# Patient Record
Sex: Female | Born: 1965
Health system: Southern US, Community
[De-identification: ages and names within clinical notes are randomized; demographics above are authoritative.]

## PROBLEM LIST (undated history)

## (undated) DIAGNOSIS — Z808 Family history of malignant neoplasm of other organs or systems: Secondary | ICD-10-CM

## (undated) DIAGNOSIS — Z800 Family history of malignant neoplasm of digestive organs: Secondary | ICD-10-CM

## (undated) DIAGNOSIS — Z803 Family history of malignant neoplasm of breast: Secondary | ICD-10-CM

## (undated) DIAGNOSIS — Z836 Family history of other diseases of the respiratory system: Secondary | ICD-10-CM

## (undated) HISTORY — DX: Family history of malignant neoplasm of breast: Z80.3

## (undated) HISTORY — DX: Family history of other diseases of the respiratory system: Z83.6

## (undated) HISTORY — DX: Family history of malignant neoplasm of digestive organs: Z80.0

## (undated) HISTORY — DX: Family history of malignant neoplasm of other organs or systems: Z80.8

---

## 1998-03-31 ENCOUNTER — Inpatient Hospital Stay (HOSPITAL_COMMUNITY): Admission: AD | Admit: 1998-03-31 | Discharge: 1998-04-02 | Payer: Self-pay | Admitting: Obstetrics and Gynecology

## 1998-04-03 ENCOUNTER — Encounter: Admission: RE | Admit: 1998-04-03 | Discharge: 1998-07-02 | Payer: Self-pay | Admitting: Obstetrics and Gynecology

## 1999-10-18 ENCOUNTER — Ambulatory Visit (HOSPITAL_COMMUNITY): Admission: RE | Admit: 1999-10-18 | Discharge: 1999-10-18 | Payer: Self-pay | Admitting: Gastroenterology

## 2001-06-25 ENCOUNTER — Ambulatory Visit (HOSPITAL_COMMUNITY): Admission: AD | Admit: 2001-06-25 | Discharge: 2001-06-25 | Payer: Self-pay | Admitting: Obstetrics and Gynecology

## 2001-09-15 ENCOUNTER — Inpatient Hospital Stay (HOSPITAL_COMMUNITY): Admission: AD | Admit: 2001-09-15 | Discharge: 2001-09-17 | Payer: Self-pay | Admitting: Obstetrics and Gynecology

## 2001-09-18 ENCOUNTER — Encounter: Admission: RE | Admit: 2001-09-18 | Discharge: 2001-10-18 | Payer: Self-pay | Admitting: Obstetrics and Gynecology

## 2001-10-31 ENCOUNTER — Other Ambulatory Visit: Admission: RE | Admit: 2001-10-31 | Discharge: 2001-10-31 | Payer: Self-pay | Admitting: Obstetrics and Gynecology

## 2002-11-01 ENCOUNTER — Other Ambulatory Visit: Admission: RE | Admit: 2002-11-01 | Discharge: 2002-11-01 | Payer: Self-pay | Admitting: Obstetrics and Gynecology

## 2003-12-19 ENCOUNTER — Other Ambulatory Visit: Admission: RE | Admit: 2003-12-19 | Discharge: 2003-12-19 | Payer: Self-pay | Admitting: Obstetrics and Gynecology

## 2005-01-17 ENCOUNTER — Ambulatory Visit (HOSPITAL_COMMUNITY): Admission: RE | Admit: 2005-01-17 | Discharge: 2005-01-17 | Payer: Self-pay | Admitting: Gastroenterology

## 2005-03-09 ENCOUNTER — Other Ambulatory Visit: Admission: RE | Admit: 2005-03-09 | Discharge: 2005-03-09 | Payer: Self-pay | Admitting: Obstetrics and Gynecology

## 2005-08-08 ENCOUNTER — Ambulatory Visit (HOSPITAL_COMMUNITY): Admission: RE | Admit: 2005-08-08 | Discharge: 2005-08-08 | Payer: Self-pay | Admitting: Obstetrics and Gynecology

## 2005-08-08 ENCOUNTER — Encounter (INDEPENDENT_AMBULATORY_CARE_PROVIDER_SITE_OTHER): Payer: Self-pay | Admitting: *Deleted

## 2010-06-12 ENCOUNTER — Encounter: Admission: RE | Admit: 2010-06-12 | Discharge: 2010-06-12 | Payer: Self-pay | Admitting: Orthopedic Surgery

## 2010-10-20 ENCOUNTER — Encounter: Payer: Self-pay | Admitting: Psychology

## 2010-10-20 DIAGNOSIS — F329 Major depressive disorder, single episode, unspecified: Secondary | ICD-10-CM

## 2010-10-21 ENCOUNTER — Ambulatory Visit: Payer: Self-pay | Admitting: Psychology

## 2010-10-27 ENCOUNTER — Encounter: Payer: Self-pay | Admitting: Psychology

## 2010-11-12 ENCOUNTER — Encounter: Payer: Self-pay | Admitting: Psychology

## 2010-12-15 ENCOUNTER — Encounter: Payer: Self-pay | Admitting: Psychology

## 2010-12-16 NOTE — Miscellaneous (Signed)
Summary: Consent: services provided by [facility]  Consent: services provided by [facility]   Imported By: Knox Royalty 10/27/2010 10:29:43  _____________________________________________________________________  External Attachment:    Type:   Image     Comment:   External Document
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
## 2010-12-16 NOTE — Assessment & Plan Note (Signed)
Summary: Behavioral Medicine Student Consultation   History of Present Illness: Patient presented with pleasant affect. Rate of speech was normal and thought content was clear and goal-oriented. Patient rated her pain symptoms over the past week at 6 out of 10 (with 10 being the worst). Patient stated that she went to an acupuncture treatment earlier in the day and rated her pain in her shoulders and neck for today at a 4. Patient also reported following through with homework from previous session to engage in a pleasant event (she got a massage). Patient indicated that she recently experienced several psychosocial stressors. Specifically, the patient's 44 yo maternal grandmother was hospitalized for medical reasons and subsequenty placed in assisted living. Additionally, the patient reported that her mother recently experienced medical difficulties during a recent shopping trip with her. Patient reported feeling stressed by her role as a caregiver for her mother, grandmother, as well as her children. Patient stated that she would be helping move her grandmother into assisted living.   Impression & Recommendations:  Problem # 1:  DEPRESSIVE DISORDER NOT ELSEWHERE CLASSIFIED (ICD-311)  During the session, Yulia and I reviewed her homework from the previous week. We discussed possible directions to take for the session and established a goal to address symptoms of dysphoria and irritability. I introduced cognitive behavioral therapy (CBT) as an approach to address these symptoms and Rosela agreed with this plan. I used two recent examples from Jenevieve's life during the past month to explain the influence of thoughts on her mood, physiology, and behavior. Delanee was receptive to using CBT to address her symptoms; she noted a tendency to engage in negative thinking when difficult situations arose in her life. Patient noted that she had an upcoming stressor in her life - spending time with her paternal  aunt over the holidays - and we briefly discussed options for coping with negative interactions with her. Patient noted that she tended to either cry or drink alcohol to cope with her aunt. I explained a CBT handout that involved tracking situations, emotions, and behaviors and requested Kammie complete the handout for homework. I solicited feedback from Ogden about the structure of the sessions and she stated that they went "well" and stated that there were no parts of the session that were unhelpful.  Plan:  Appointment scheduled for 12/30/2011at 10 am. (Patient noted that she felt that she was doing better and preferred to meet after the holidays). Discuss common cognitive distortions and review cognitive distortions handout. Use example from homework to introduce idea of a thought record and complete thought record in session. Assign thought record for homework.   Michel Bickers  Behavioral Medicine Student  October 27, 2010 4:10 PM   Orders: No Charge Patient Arrived (NCPA0) (NCPA0)   Orders Added: 1)  No Charge Patient Arrived (NCPA0) [NCPA0]  Appended Document: Behavioral Medicine Student Consultation Reviewed note.  Will review encounter with student next opportunity.  Agree with assessment and plan.

## 2010-12-16 NOTE — Assessment & Plan Note (Signed)
Summary: Behavioral Medicine Student Consultation   History of Present Illness: Patient is a 45 yo married Caucasian female who was referred by her orthopedist for a behavioral consultation. Patient requested consultation to address symptoms of dysphoria, decreased motivation, and low energy. Patient also reported often feeling on edge. Patient denied current sleep or appetite problems. Patient stated that she tends to feel most dysphoric during the winter monthes (January - March) and noticed this pattern over the past 2 years. Additionally, she reported current pain symptoms in her neck, shoulders, and upper back; patient indicated that her pain symptoms had been evaluated, but no physical cause had been identified. Patient reported that her pain symptoms began in March 2011 and symptoms of dysphoria began in the spring of 2011. Patient reported several recent stressors, including the unexpected death of her father in 2011/05/18and her sister's car accident in August 2011. Patient noted that she felt she could benefit from additional processing of her grief.  Relevant Medical History: Note that patient does not have a primary care physician at [facility] (no records in EMR). As stated above, patient reported pain in her back, shoulders, and neck. She also reported "possible TMJ" symptoms per her ENT, and stated that her ears were often "stopped up." Patient also reported irregularity in her menstrual cycle over the past year.  Medications: Patient does not currently take any medications. She started Lyrica on 10/15/10 (prescribed by her orthopedist), but stopped it due to side effects. Patient has taken Xanax in the past for anxiety related to flying and Ambien in the past for sleep difficulty during travel as prescribed by her OB/GYN. Patient reported that she was prescribed birth control 2 years ago to treat her menstrual cycle irregularity, but stopped it due to side  effects.     Treatment History: Patient indicated that she participated in individual therapy (3-4 sessions) approximately 2-3 years ago, but did not find the sessions effective due to lack of structure.  Education and Occupational History: Patient graduated from [employer]. 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  Family History: Patient currently lives in [place] with her husband of 17 years and two daughters (ages 38 and 62). Patient's husband is employed as a Psychologist, occupational. Patient reported no marital problems. Patient reported close relationships with all family members. She reported some stress associated with parenting her 62 yo daughter. Patient has a 48 yo sister and reported that they have a close relationship. Patient reported that she lost her brother in a car accident in the 70s. Patient's mother lives in [place].  Family History of Psychopathology: Patient reported that her 34 yo daughter has central auditory processing disorder. Patient reported that her sister was diagnosed with depression and has current substance abuse problems (alcohol and marijuana). Patient reported that her maternal uncle was diagnosed with schizophrenia.  Social History: Patient reported that she exercises regulary (tennis and running) approximately 50-60 minutes 5-6 times per week. Patient indicated that she has made friends through exercise and has several supportive friends that she sees regularly.  Other: Patient reported that she tends to feel better when she is at an appropriate weight, and feels worse when her weight is up. She reported extreme dieting in the past (750 calories per day) when she was a teenager, and stated that she began dieting at age 30. Note that patient's current weight appears to be normal.     Impression & Recommendations:  Problem # 1:  DEPRESSIVE DISORDER NOT ELSEWHERE CLASSIFIED (ICD-311) Patient  was appropriately groomed for the session and her weight appeared normal for her height. Patient reported experiencing dysthymic mood. She presented with pleasant and appropriate affect throughout the session and her rate of speech and rythym was normal. Patient's thought process was clear and goal-directed. Patient denied current or past suicidal or homicidal ideation.  PHQ-9: Patient endorsed feeling tired or having little energy nearly every day, feeling down more than half the days, several days with little interest. Total score = 6  Overview: Patient presents with dysphoria, pain symptoms, unresolved grief related to father's death, and stress associated with parenting her 75 yo daughter.  Plan: Will meet with patient on 10/27/10 at 1 pm. Will offer patient a choice regarding the agenda for the second session (dysphoria, stress with parenting, grief). Will ask patient about family members' perceptions of her symptoms. Will use a CBT approach to examine automatic thoughts that arise when the patient has a dysphoric or irritable mood (if she chooses to examine dysphoria). Will request feedback from the patient regarding the outcome of the session to further build the therapeutic alliance.    Michel Bickers  Behavioral Medicine Student  October 20, 2010 4:20 PM   Appended Document: Orders Update    Clinical Lists Changes  Orders: Added new Service order of No Charge Patient Arrived (NCPA0) (NCPA0) - Signed
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
## 2010-12-30 ENCOUNTER — Encounter: Payer: Self-pay | Admitting: Psychology

## 2010-12-30 DIAGNOSIS — F329 Major depressive disorder, single episode, unspecified: Secondary | ICD-10-CM

## 2010-12-30 NOTE — Assessment & Plan Note (Signed)
Patient exhibited pleasant affect throughout most of the session. She appeared frustrated at times when discussing her sister, and once became tearful. We primarily discussed the patient's relationship with her sister. I educated the patient about the requirements for hospitalization and asked the her about the severity of her sister's depression. We concluded that there was no evidence that her sister would harm herself. We also discussed evidence that her sister could improve (e.g., she had been depressed before and gotten better, most depressions remit on their own). We also discussed the function of the patient checking on her sister. I brought up the idea that the patient had a pattern of checking on family members, including her mother and daughter in addition to her sister. She noted that she mainly checked on her sister to alleviate her own anxiety and made the connection that her checking on her sister probably was not helpful, and could potentially be harmful, to her sister. She agreed to reduce her checking behavior to every other day or less. We also discussed the pros and cons of the patient and her family's idea to make a deal with her sister so that she attended therapy. Throughout the session, we discussed the difference between helping her sister and trying to manage and fix her sister. Additionally, we discussed the notion that the patient was not responsible for her sister's happiness. Toward the end of the session, the patient generated a list of conclusions that she had made based on the session to help guide her thinking. She agreed to review conclusions over the week and if she woke up worrying at night. I also recommended deep breathing and/or reading to help her fall back asleep. We also discussed her goals for future therapy sessions; she indicated that she wanted to spend at least one more session on the current relationship with her sister and then wanted to spend some time discussing her  parenting.   Plan: Patient requested to meet in one week. Scheduled session for 01/05/11 at 10 am. Check in about homework regarding reducing contact with sister, reviewing conclusions. Examine patient's current thoughts about her relationship with her sister; if they are still catastrophic (e.g. sister may be suicidal if I do not check on her, etc.), complete a thought record in session examining validity of those thoughts. Further discuss the function of patient's desire to manage her sister's life, as well as how this relates to other relationships.   Michel Bickers   Behavioral Medicine Student 12:16 pm  NOTE REVIEWED AND SIGNED BY SUPERVISING PSYCHOLOGIST.  CASE DISCUSSED.

## 2010-12-30 NOTE — Progress Notes (Deleted)
  Subjective:    Patient ID: [name], female    DOB: 10/05/1966, 44 y.o.   MRN: [id number]  HPI    Review of Systems     Objective:   Physical Exam        Assessment & Plan:   

## 2010-12-30 NOTE — Progress Notes (Signed)
Patient reported that she primarily wanted to discuss her relationship with her sister. She indicated that her sister missed her individual therapy appointment yesterday and was uncertain if she would reschedule it. She noted that her sister had sent an e-mail to family members to tell them this and asked them not to call her. Patient reported that she felt angry that her sister would not take steps to help herself, and also reported feeling very worried about her sister. She questioned me about what would be necessary to "commit" her sister into the hospital. She indicated that she was worrying about her sister constantly and that her sister's depression and inability to access help was "hijacking" her life. Patient indicated that she sometimes woke up during the night worrying about her sister and texted her daily to make sure that her sister was okay. The patient described her own behavior as obsessive. She also noted that she and her aunt were thinking about making a deal with her sister such that they would pay her bills in exchange for her going to therapy appointments. The patient wanted my feedback about this decision and her relationship with her sister in general. In regard to her relationship with her daughter, the patient reported that she felt it had improved primarily because her attention had been focused on her sister rather than her daughter. She noted that she went shopping with her daughter and that the shopping trip was enjoyable

## 2011-01-05 ENCOUNTER — Encounter: Payer: Self-pay | Admitting: Psychology

## 2011-01-05 NOTE — Progress Notes (Addendum)
History of Present Illness:  [name] primarily discussed her relationship with her sister and 44 year old daughter, [name], during the session. In regard to her homework, she stated that she had been able to successfully reduce her contact with her sister. She stated that she did not call or text her sister to check on her, and had three natural interactions with her over the past week, one of which was initiated by her sister. [name] also reported that she had decided not to be involved in her sister's financial affairs and was not loaning her any money. Additionally, [name] noted that her sister had e-mailed her to say that she was feeling better mentally and had some recent interactions with friends and applied for a new job. [name] reported that her anxiety symptoms had decreased due to her reduced contact with her sister, and the fact that her sister's depressive symptoms seemed to be getting better. She noted that she was no longer waking in the middle of the night worried about her.   In regard to her relationship with her daughter, [name] indicated that she was concerned that [name] acted disrespectful toward her at home. For example, she stated that [name] recently told her "No" when asked to complete a reading assignment. [name] also noted that [name] was disorganized and did not have a set time that she used for homework. She noted that [name] was very polite and respectful at school, but sometimes refused to complete tasks at home. [name] stated that [name] often indicated that she was simply being sarcastic, and that her behavior was a reflection of her personality, but [name] did not believe this to be true. In terms of discipline, [name] indicated that she tried to pick the right behaviors to discipline and would take away [name]'s cell phone if necessary. [name] noted that her husband had a different parenting style from hers, and tended not to be bothered by [employer] behavior. Specifically, she  stated that he is a caring father, but is less engaged. Additionally, [name] questioned whether it was appropriate for her to occasionally check [name]'s messages. She also indicated that [name], although underweight (5'5, 105 lbs.), enjoys eating sweets and often seems concerned about her weight; [name] questioned how to handle this behavior. [name] noted that she tried to talk about food in terms of good and poor choices, rather than food's effect on weight, looks, etc.   Assessment:  [name] presented with pleasant affect and her thought content was clear and goal-directed. As noted, [name] reported reduced anxiety symptoms. She also indicated that her neck and shoulder pain was 90% improved; she stated that her physical therapist had determined that the pain was related to weakness in her back, and stated that her pain has been better since she started specific back and shoulder exercises. We reviewed [name]'s progress in relation to her sister. I reinforced her decision to reduce her checking behaviors and refrain from loaning her sister money. I also underscored the notion that [name]'s decision to reduce contact with her led to reduced anxiety. [name] requested psychoeducation regarding the difference between abuse and addiction (which I provided) in relation to her sister's substance abuse. She noted that her sister's difficulties with alcohol and marijuana seemed consistent with abuse.   In regard to her relationship with her daughter, I introduced the idea of [name] collaborating with her daughter to establish a set homework time so that [name] would not feel that it was necessary to ask [name] about her homework throughout the evening. [name]  was receptive to this idea. We also discussed communicating with [name] in a way that gave her a choice (e.g., "When are you going to do your homework?") rather than requesting her to complete a task while she is in the middle of an activity. I also noted  that [name] obtains good grades and that it may be reasonable to allow [name] to complete homework on her own (although I do not think [name] is willing to go this route). I also reinforced and reminded [name] that a lot was going well with her daughter, and that these successes were likely related to positive influences from  [name] and her husband. Additionally, we discussed ways for December to enjoy spending time with [name] on a daily basis. She noted that they both liked to watch several TV shows together and I encouraged [name] to plan to watch several shows with [name] during the week. In regard to monitoring [name]'s text messages, I did not give a definitive answer but noted that she and her husband should be in agreement regarding this rule. We spoke briefly regarding [name]'s concerns about her weight; I reinforced [name] for focusing on food as a source of nutrition, rather than its effect on weight.   Plan:   Meet in two weeks. Review homework to establish homework time with [name] and engage in pleasant events with her (TV). [name] questions her parenting; find ways in session to restore her confidence in her abilities. Determine if communication with [name] continues to be an area of difficulty and assess and address those areas; if the relationship has improved, revisit goals for therapy.   Patient seen and visit documented by [name], [employer] student.  Visit and note reviewed by [name], Psy.D.  Supervision provided.

## 2011-01-05 NOTE — Progress Notes (Deleted)
  Subjective:    Patient ID: [name], female    DOB: 11/28/1965, 44 y.o.   MRN: [id number]  HPI    Review of Systems     Objective:   Physical Exam        Assessment & Plan:   

## 2011-01-20 ENCOUNTER — Encounter: Payer: Self-pay | Admitting: Psychology

## 2011-01-20 DIAGNOSIS — F329 Major depressive disorder, single episode, unspecified: Secondary | ICD-10-CM

## 2011-01-20 NOTE — Assessment & Plan Note (Addendum)
Client presented with pleasant affect and her thought content was clear and goal-directed. She reported no current symptoms of psychopathology. We primarily discussed her relationship with her daughter, Maralyn Sago. Overall, client reported no current problems within their relationship. We revisited setting a standard homework time and client noted this was difficult due to Ivor busy schedule, but agreed to the idea of setting a time by which homework needed to be complete. We also discussed the appropriateness of client checking Sarah's text messages and I provided client with research documenting that the majority of parents check the content of their teenagers' phones-and that they have a right to do so as parents. Client also requested some guidance regarding how best to help her daughter manage money she earned babysitting and we briefly discussed this issue. I reinforced client's parenting style, and provided psychoeducation regarding authoritative parenting and its association with positive outcomes. Toward the end of the session, we discussed termination of therapy given client's current mental state and client suggested that we schedule one final session in three weeks in case she had issues to discuss. We agreed that client should call and cancel the session if she felt that it was unnecessary.  Plan: Meet in three weeks for a final session to discuss parenting unless otherwise noted from client.  Session conducted by Michel Bickers, [employer] Student Case and note reviewed with Spero Geralds, Psy.D., supervisor

## 2011-01-20 NOTE — Progress Notes (Deleted)
  Subjective:    Patient ID: [name], female    DOB: 10/06/1966, 44 y.o.   MRN: [id number]  HPI    Review of Systems     Objective:   Physical Exam        Assessment & Plan:   

## 2011-01-20 NOTE — Progress Notes (Signed)
Client reported that she had little to discuss during the session. She noted that overall her parenting had been going well with her daughter, Maralyn Sago. Client reported that she had been making an effort to spend more time engaging in fun activities with Maralyn Sago and indicated that Maralyn Sago had recently opened up to her regarding current events at school. Client also reported that Maralyn Sago continues to obtain excellent grades. In terms of her relationship with her sister, client reported that she had remained "uninvolved" with her sister's finances and mental health treatment and communicates with her primarily about general topics. Client also noted that her sister's depressive symptoms have improved to some extent over the last two weeks.

## 2011-03-08 ENCOUNTER — Encounter: Payer: Self-pay | Admitting: Psychology

## 2011-03-08 DIAGNOSIS — F329 Major depressive disorder, single episode, unspecified: Secondary | ICD-10-CM

## 2011-03-08 NOTE — Progress Notes (Signed)
Reviewed note and provided supervision to [facility].  Case is being closed with goals identified as met.

## 2011-03-08 NOTE — Assessment & Plan Note (Signed)
Patient presented with pleasant affect and her thought content was clear and goal-directed. We primarily discussed patient's parenting of her 44 year old daughter. Regarding all of the parenting issues she raised, I encouraged patient to discuss her concerns with her husband and to make decisions and parent as a team when possible. I explained that her daughter's desire to spend time in her room likely reflects a natural desire for increased independence in adolescence. Regarding her daughter's tendency to lock her door, we discussed the notion of having a family policy of knocking before opening when doors are closed, and not locking doors. Patient was receptive to this idea. I also encouraged patient to validate her daughter's interest in privacy, but also explain her reasons for requiring that her door remain unlocked. In terms of her daughter's recent spending habits, I explained the idea of using natural consequences with children and adolescents to teach a behavior or skill; specifically, I explained that allowing her daughter to go on a shopping trip without money may help her learn to better manage her finances. We also discussed the idea of patient allowing her daughter to return recent items she purchased back to the store so that she would have spending money for the weekend. Patient also raised the idea of allowing her daughter to borrow a small amount of money on the condition she pay it back. Patient acknowledged this strategy would not be as effective in helping her daughter understand the value of money. Patient indicated that she would talk to her husband about it and they would make a decision together. Regarding her daughter's school habits, we discussed the idea of patient reviewing her daughter's planner with her at the beginning of the week for consecutive weeks, and eventually allowing her daughter to manage her planner and assignments on her own. I discouraged patient from working with her  daughter to fill out her planner. We discussed the notion that if her daughter forged her signature again on an assignment, there should be a consequence for that behavior. At the end of the session, we discussed several gains the patient had made in therapy and patient thanked me for my services.

## 2011-03-08 NOTE — Progress Notes (Signed)
  Subjective:    Patient ID: [name], female    DOB: 12/21/1965, 44 y.o.   MRN: [id number]  HPI    Review of Systems     Objective:   Physical Exam        Assessment & Plan:   

## 2011-03-08 NOTE — Progress Notes (Signed)
[employer] Graduate Student Michel Bickers met with [name] for a final therapy session.  This is her documentation from that visit:  Patient indicated that overall she had been doing well since her last session. She reported that she has maintained a healthier relationship with her sister and has weekly telephone contact, but is no longer overly involved in her life. Patient mainly wanted to discuss several parenting issues regarding her 78 year old daughter. She indicated that her daughter was spending more time in her room given that she did not have as many after-school activities, and was curious if this was typical behavior for adolescents. She reported no changes in her daughter's mood or concerns about depression, noting that her daughter liked to read magazines and play with makeup in her room. Patient also indicated that her daughter sometimes locked her door, which bothered her. Additionally, patient reported that her daughter recently spent all of her money from babysitting on makeup, and now had no money to spend this weekend when she goes shopping with a friend, and wondered if she should loan her daughter money. She reported having a difficult time saying "no" to her children. Lastly, patient reported that her daughter was earning A's and B's but did not consistently use a planner, and had recently forged her signature on an assignment because she had forgotten to get the signature in time (her daughter disclosed this to her). Patient wanted to know how best to motivate her daughter to use a planner so that she did not forget assignments or requirements.

## 2011-03-08 NOTE — Progress Notes (Signed)
  Subjective:    Patient ID: [name], female    DOB: 05/24/1966, 45 y.o.   MRN: [id number]  HPI    Review of Systems     Objective:   Physical Exam        Assessment & Plan:

## 2011-04-01 NOTE — Procedures (Signed)
. [facility]  Patient:    [name]                      MRN: [id number] Proc. Date: 10/18/99 Adm. Date:  [id number] Attending:  Louie Bun CC:         Guy Sandifer. Arleta Creek, M.D.                           Procedure Report  PROCEDURE:  Colonoscopy.  SURGEON:  John C. Madilyn Fireman, M.D.  INDICATIONS:  Family history of colon cancer in a first degree relative at a young age.  PROCEDURE:  The patient was placed in the left lateral decubitus position and placed on the pulse monitor with continuous low-flow oxygen delivered by nasal cannula.  She was sedated with 100 mg IV Demerol and 10 mg IV Versed.  The Olympus video colonoscope was inserted into the rectum and advanced to the cecum, confirmed by transillumination of McBurneys point and visualization of the ileocecal valve and appendiceal orifice.  The prep was excellent.  The cecum, ascending, transverse, descending, and sigmoid colon all appeared normal with no masses, polyps, diverticuli or other mucosal abnormalities.  The rectum, likewise appeared normal and retroflexed view of the anus revealed some small internal hemorrhoids. The colonoscope is then withdrawn and the patient returned to the recovery room in stable condition.  She tolerated the procedure well and there were no immediate  complications.  IMPRESSION:  Internal hemorrhoids, otherwise normal colonoscopy.  PLAN:  Repeat colonoscopy in five years. DD:  10/18/99 TD:  10/19/99 Job: [id number]

## 2011-04-01 NOTE — Op Note (Signed)
[facility]  Patient:    [name], [name] Visit Number: [id number] MRN: [id number]          Service Type: OBS Location: 910A 9105 [id number] Attending Physician:  Soledad Gerlach Dictated by:   Guy Sandifer Arleta Creek, M.D. Proc. Date: 09/15/01 Admit Date:  09/15/2001 Discharge Date: 09/17/2001                             Operative Report  DELIVERY NOTE  SURGEON:                      Fayrene Fearing E. Arleta Creek, M.D.  INDICATIONS AND CONSENT:      The patient is a 45 year old married white female, G2, P53, [facility] September 23, 2001.  Her prenatal care was complicated by a history of rapid labor and also a shoulder dystocia during delivery with a 6 b 8 oz baby.  Group B strep culture is negative.  HOSPITAL COURSE:              The patient was admitted to the hospital with a cervical exam of 3 cm dilation, 50% effacement, -2 station and vertex. Artificial rupture of membranes for clear fluid was carried out.  The patient then ambulated.  She was subsequently started on Pitocin and had an epidural placed.  She was then noted to have decelerations.  Cervical examination at that time revealed complete dilation, complete effacement and +2 station.  The baby was decelerating to the 40s with contractions.  The patient had a very dense epidural and she was pushing fairly well.  The baby was +3 station. However, secondary to the continued deep decelerations, vacuum extraction was recommended.  This was discussed with the patient and her husband and a 1/40,000 risk of severe morbidity or mortality was discussed.  All questions were answered.  A second-degree midline episiotomy was performed.  The Kiwi vacuum extractor was then placed and suction was applied to the green zone on the handle.  Then, over one contraction with one easy pull using two fingers for pulling only, the vertex was delivered without difficulty in the OA presentation.  He oral and nasopharynx were  suctioned.  A nuchal cord x 1 was noted.  It was a little too tight to reduce and the baby was delivered through it.  Good cry and tone was noted on delivery.  The oral and nasopharynx were further suctioned.  The cord was clamped and cut.  The placenta was manually delivered and three vessels were noted.  The cervix and vagina were without lesion.  The second-degree midline episiotomy was repaired in the standard fashion with 2-0 Rapide.  The patient and infant were stable in the labor and delivery room.  Viable female infant.  Apgars of 8 and 9 at one and five minutes respectively were noted.  Arterial cord pH and birth weight are pending a the time of dictation. Dictated by:   Guy Sandifer Arleta Creek, M.D. Attending Physician:  Soledad Gerlach DD:  09/15/01 TD:  09/17/01 Job: [id number]

## 2011-04-01 NOTE — Op Note (Signed)
[name], [name]            ACCOUNT NO.:  [id number]   MEDICAL RECORD NO.:  [id number]          PATIENT TYPE:  AMB   LOCATION:  SDC                           FACILITY:  [facility]   PHYSICIAN:  Guy Sandifer. Henderson Cloud, M.D. DATE OF BIRTH:  [birth year]   DATE OF PROCEDURE:  08/08/2005  DATE OF DISCHARGE:                                 OPERATIVE REPORT   PREOPERATIVE DIAGNOSIS:  Menometrorrhagia.   POSTOPERATIVE DIAGNOSIS:  Menometrorrhagia.   PROCEDURE:  1.  Hysteroscopy with dilatation and curettage.  2.  A 1% Xylocaine paracervical block.   SURGEON:  Guy Sandifer. Henderson Cloud, M.D.   ANESTHESIA:  General with LMA.   ESTIMATED BLOOD LOSS:  50 cc.   SPECIMENS:  Endometrial curettings.   INS AND OUTS:  Sorbitol distending media 120 cc deficit with a lot of that  on the floor.   INDICATIONS AND CONSENT:  The patient is a 45 year old married white female  G2, P2 with progressively heavier menses. Details were dictated in the  History and Physical. Hysteroscopy with resectoscope, dilatation and  curettage has been discussed preoperatively. Potential risks and  complications were discussed preoperatively including but limited to  infection, uterine perforation, organ damage, bleeding requiring transfusion  of blood products with possible transfusion reaction, HIV and hepatitis  acquisition, DVT, PE, pneumonia, laparotomy, laparoscopy, hysterectomy and  recurrent abnormal bleeding. All questions were answered and consent is  signed on the chart.   FINDINGS:  Fallopian tube ostia are identified bilaterally. There is an  approximately 1-2 cm ridge of endometrium on the posterior left endometrial  cavity.   PROCEDURE:  The patient is taken to the operating room where she is  identified, placed in dorsal supine position. General anesthesia is induced  via LMA and she is placed in dorsal lithotomy position. She is then prepped,  bladder straight catheterized. She is draped in sterile fashion.  Bivalve  speculum is placed in the vagina. The anterior cervical lip was injected  with 1% Xylocaine plain and grasped with single-tooth tenaculum.  Paracervical block is then placed at the 2, 4, 5, 7, 8 and 10 o'clock  positions with approximately 20 cc total of plain 1% Xylocaine. Cervix was  gently progressively dilated to 27 Pratt dilator and diagnostic hysteroscope  was placed in the endocervical canal and advanced under direct view  visualization using sorbitol distending media. The above findings were  noted. Diagnostic hysteroscope is withdrawn. Cervix was dilated to a 31  Pratt dilator and the resectoscope with a single right-angle wire loop was  advanced under direct visualization. In the endometrial cavity, the above  described area of endometrium has essentially been flattened by the dilators  and the hysteroscope. There is no distinct mass to resect at that point.  Therefore, the resectoscope was removed and sharp curettage was carried out.  The cavity feels clean.  Procedure was terminated. The tenaculum had pulled through at one point on  the anterior cervical lip. This was repaired with zero chromic suture. All  counts correct. The patient is awakened and taken to recovery room in stable  condition. Dissection,  Guy Sandifer Henderson Cloud, M.D.  Electronically Signed     JET/MEDQ  D:  08/08/2005  T:  08/08/2005  Job:  [id number]

## 2011-04-01 NOTE — H&P (Signed)
[name], [name]            ACCOUNT NO.:  [id number]   MEDICAL RECORD NO.:  [id number]          PATIENT TYPE:  AMB   LOCATION:  SDC                           FACILITY:  [facility]   PHYSICIAN:  Guy Sandifer. Henderson Cloud, M.D. DATE OF BIRTH:  [birth year]   DATE OF ADMISSION:  08/08/2005  DATE OF DISCHARGE:                                HISTORY & PHYSICAL   CHIEF COMPLAINT:  Irregular menses.   HISTORY OF PRESENT ILLNESS:  This patient is a 45 year old married white  female, gravida 2, para 2, whose menses have become progressively irregular  and heavy.  She was also having some left lower abdominal discomfort.  Ultrasound on July 07, 2005, revealed uterus measuring 8.3 x 3.8 x 5.3 cm.  Sonohysterogram showed a broad-based 2.2 cm mass on the posterior wall  consistent with a probable polyp.  The left ovary had a 2.2 cm simple cyst.  The patient states that her left pelvic pain has now resolved.  Hysteroscopy  with resectoscope, dilatation and curettage has been discussed with the  patient.  Potential risks and complications have been reviewed  preoperatively.   PAST MEDICAL HISTORY:  Negative.   PAST SURGICAL HISTORY:  Laparoscopy in 1998.   PAST OBSTETRICAL HISTORY:  Vaginal delivery x2.   FAMILY HISTORY:  Colon and pancreatic cancer in mother.  Colon cancer in  paternal grandmother.  CVA in paternal grandfather.  Migraine headaches in  mother.  Schizophrenia in uncle.   SOCIAL HISTORY:  Denies tobacco, alcohol, or drug abuse.   MEDICATIONS:  None.   ALLERGIES:  No known drug allergies.   REVIEW OF SYSTEMS:  NEUROLOGY:  Denies headache.  CARDIOVASCULAR:  Denies  chest pain.  PULMONARY:  Denies shortness of breath.  GASTROINTESTINAL:  Denies recent changes in bowel habits.   PHYSICAL EXAMINATION:  VITAL SIGNS:  Height 5 feet 0 inches, blood pressure  100/68.  LUNGS:  Clear to auscultation.  HEART:  Regular rate and rhythm.  BACK:  Without CVA tenderness.  BREASTS:  Not  examined.  ABDOMEN:  Soft and nontender without masses.  PELVIC:  Vulva, vagina, and cervix without lesion.  Uterus is mobile, mildly  tender, upper normal size.  Adnexa nontender without palpable masses.  EXTREMITIES:  Grossly within normal limits.  NEUROLOGY:  Grossly within normal limits.   ASSESSMENT:  Menometrorrhagia.   PLAN:  Hysteroscopy with resectoscope, dilatation and curettage.      Guy Sandifer Henderson Cloud, M.D.  Electronically Signed     JET/MEDQ  D:  08/04/2005  T:  08/04/2005  Job:  [id number]

## 2011-04-01 NOTE — Op Note (Signed)
NAMEJORDYN, [name]            ACCOUNT NO.:  [id number]   MEDICAL RECORD NO.:  [id number]          PATIENT TYPE:  AMB   LOCATION:  ENDO                         FACILITY:  [facility]   PHYSICIAN:  John C. Madilyn Fireman, M.D.    DATE OF BIRTH:  01-10-1966   DATE OF PROCEDURE:  01/17/2005  DATE OF DISCHARGE:                                 OPERATIVE REPORT   PROCEDURE:  Colonoscopy.   INDICATION FOR PROCEDURE:  Family history of colon cancer in first-degree  relative.   PROCEDURE:  The patient was placed in the left lateral decubitus position  and placed on the pulse monitor with continuous low-flow oxygen delivered by  nasal cannula. He was sedated with 100 mcg IV fentanyl and 10 mg IV Versed.  Olympus video colonoscope was inserted into the rectum and advanced to  cecum, confirmed by transillumination of McBurney's point and visualization  of ileocecal valve and appendiceal orifice. Prep was excellent. The cecum,  ascending, transverse, descending and sigmoid colon all appeared normal with  no masses, polyps, diverticula or other mucosal abnormalities. The rectum  likewise appeared normal. Retroflexed view of the anus revealed no obvious  internal hemorrhoids. The scope was then withdrawn and the patient returned  to the recovery room in stable condition. She tolerated the procedure well.  There were no immediate complications.   IMPRESSION:  Normal study.   PLAN:  Repeat colonoscopy five years based on her family history.      JCH/MEDQ  D:  01/17/2005  T:  01/17/2005  Job:  [id number]   cc:   Guy Sandifer. Arleta [place], M.D.  [address]  [place]  [place] [address]  Fax: [phone]

## 2012-04-21 ENCOUNTER — Ambulatory Visit: Payer: BC Managed Care – PPO

## 2012-08-31 ENCOUNTER — Ambulatory Visit (INDEPENDENT_AMBULATORY_CARE_PROVIDER_SITE_OTHER): Payer: BC Managed Care – PPO | Admitting: Licensed Clinical Social Worker

## 2012-08-31 DIAGNOSIS — F4323 Adjustment disorder with mixed anxiety and depressed mood: Secondary | ICD-10-CM

## 2013-09-12 ENCOUNTER — Ambulatory Visit (INDEPENDENT_AMBULATORY_CARE_PROVIDER_SITE_OTHER): Payer: BC Managed Care – PPO | Admitting: Sports Medicine

## 2013-09-12 ENCOUNTER — Encounter: Payer: Self-pay | Admitting: Emergency Medicine

## 2013-09-12 ENCOUNTER — Encounter: Payer: Self-pay | Admitting: Sports Medicine

## 2013-09-12 VITALS — BP 107/70 | Ht 60.0 in | Wt 150.0 lb

## 2013-09-12 DIAGNOSIS — S76019A Strain of muscle, fascia and tendon of unspecified hip, initial encounter: Secondary | ICD-10-CM | POA: Insufficient documentation

## 2013-09-12 DIAGNOSIS — M6790 Unspecified disorder of synovium and tendon, unspecified site: Secondary | ICD-10-CM

## 2013-09-12 DIAGNOSIS — M67952 Unspecified disorder of synovium and tendon, left thigh: Secondary | ICD-10-CM

## 2013-09-12 DIAGNOSIS — M7711 Lateral epicondylitis, right elbow: Secondary | ICD-10-CM

## 2013-09-12 DIAGNOSIS — M7710 Lateral epicondylitis, unspecified elbow: Secondary | ICD-10-CM

## 2013-09-12 MED ORDER — NITROGLYCERIN 0.2 MG/HR TD PT24: MEDICATED_PATCH | TRANSDERMAL | Status: DC

## 2013-09-12 NOTE — Progress Notes (Signed)
Patient ID: [name], female   DOB: 10/13/1966, 47 y.o.   MRN: [id number] This is a 47 year old female who presents with 2 complaints.  #1 she is a complaint of right lateral hip pain which she's had for approximately 2 years. Reports the pain located on the anterior portion of the lateral hip just inferior to the iliac crest. Pain is worse with movement or direct pressure. She denies any specific injury. 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 Denies any weakness however she is limited secondary to pain. She's been to physical therapy multiple times for this. She's had dry needling done and reports that this is actually decreased her pain. No radiation of pain down the leg.  #2 she is a complaint of 3 weeks of right lateral elbow pain. Reports the pain worse when shaking hands or holding heavy objects. Also notes the pain with typing at work on a keyboard. No radiation of pain. No history of prior injuries to the elbow. She has not taken any medication for this. She admits to a fairly weak back and when playing tennis, however does not headache singlearm backhand.  Past medical history is significant for depression and allergies.  No known drug allergies  Social history nonsmoker  Review of systems as per history of present illness otherwise negative  Examination: Well-developed well-nourished 47 year old female awake alert and oriented in no acute distress BP 107/70  Ht 5' (1.524 m)  Wt 150 lb (68.04 kg)  BMI 29.3 kg/m2 Right elbow: Tenderness to palpation of the lateral epicondyle Strength 5/5 flexion, extension, pronation and supination No effusion noted No ligament tenderness No medial epicondylar tenderness  Right hip: Abduction strength 5/5 bilaterally Range of motion within normal limits with slightly greater range of motion in the right than the left. Negative Pearlean Brownie and Fadir testing No leg length discrepancy Negative logroll  Neurovascularly intact  bilateral upper and lower extremities with equal pulses  Musculoskeletal ultrasound Right elbow ultrasound was performed with images obtained of the lateral upper condyle including both the radial collateral ligament and the common extensor tendons. Small evidence of fluid within the common extensor tendon consistent with lateral epicondylitis. No tears noted.  Right hip ultrasound showed normal intra-articular hip with no evidence of fluid or bony spurring. External to the hip joint bony spurring was noted off of the anterior iliac crest within the origin of the gluteus medius muscle. There was a significant amount of fluid consistent with a pseudo-bursa around this spur.

## 2013-09-12 NOTE — Assessment & Plan Note (Signed)
Patient started on nitroglycerin protocol Given an exercise program We'll follow up in 4-6 weeks' time for repeat ultrasound and evaluation

## 2013-09-12 NOTE — Assessment & Plan Note (Signed)
Compression sleeve given today Recommended string dampener, soft grip and strength tension lowered Eccentric stretching and strengthening exercises Followup in 4-6 weeks

## 2013-09-12 NOTE — Patient Instructions (Signed)
For your tennis racket:  Use a string dampener  Make sure you are using a soft grip  Check that your string tension is around 50lbs.  Do the home exercises daily.  Nitroglycerin Protocol   Apply 1/4 nitroglycerin patch to affected area daily.  Change position of patch within the affected area every 24 hours.  You may experience a headache during the first 1-2 weeks of using the patch, these should subside.  If you experience headaches after beginning nitroglycerin patch treatment, you may take your preferred over the counter pain reliever.  Another side effect of the nitroglycerin patch is skin irritation or rash related to patch adhesive.  Please notify our office if you develop more severe headaches or rash, and stop the patch.  Tendon healing with nitroglycerin patch may require 12 to 24 weeks depending on the extent of injury.  Men should not use if taking Viagra, Cialis, or Levitra.   Do not use if you have migraines or rosacea.

## 2013-10-23 ENCOUNTER — Ambulatory Visit: Payer: BC Managed Care – PPO | Admitting: Sports Medicine

## 2013-11-19 ENCOUNTER — Ambulatory Visit (INDEPENDENT_AMBULATORY_CARE_PROVIDER_SITE_OTHER): Payer: BC Managed Care – PPO | Admitting: Sports Medicine

## 2013-11-19 VITALS — BP 107/73 | Ht 60.0 in | Wt 150.0 lb

## 2013-11-19 DIAGNOSIS — M7710 Lateral epicondylitis, unspecified elbow: Secondary | ICD-10-CM

## 2013-11-19 DIAGNOSIS — Z5189 Encounter for other specified aftercare: Secondary | ICD-10-CM

## 2013-11-19 DIAGNOSIS — M7711 Lateral epicondylitis, right elbow: Secondary | ICD-10-CM

## 2013-11-19 DIAGNOSIS — S73191D Other sprain of right hip, subsequent encounter: Secondary | ICD-10-CM

## 2013-11-19 MED ORDER — METHYLPREDNISOLONE ACETATE 40 MG/ML IJ SUSP
40.0000 mg | Freq: Once | INTRAMUSCULAR | Status: AC
  Administered 2013-11-19: 40 mg via INTRA_ARTICULAR

## 2013-11-19 NOTE — Assessment & Plan Note (Signed)
Today under US did injection  Procedure:  Injection of RT Gluteus medius spur Consent obtained and verified. Time-out conducted. Noted no overlying erythema, induration, or other signs of local infection. Skin prepped in a sterile fashion. Topical analgesic spray:none Completed without difficulty. Meds: Depomedrol 40 + 3 ccs of lidocaine 1% Pain immediately improved suggesting accurate placement of the medication. Advised to call if fevers/chills, erythema, induration, drainage, or persistent bleeding.  This is well visualized  Keep up easy exercises p 4 days of rest  Reck 2 mos and see if this improves

## 2013-11-19 NOTE — Assessment & Plan Note (Signed)
Keep up HEP  This is gradually improving

## 2013-11-19 NOTE — Progress Notes (Signed)
Patient ID: [name], female   DOB: 1966-10-10, 48 y.o.   MRN: [id number]  Patient returns for Rt tennis elbow This is somewhat better as she is doing exercises over the last month No tennis but hits 2 hand back hand and this may not have been cause of pain Some pain w computer work Elbow compression does help somewhat NTG caused head aches and did not keep using  RT hip probably just as painful No change with exercises Hurts to roll onto this Spur seen on last visit  Exam NAD  RT tlbow full ROM Good strength on wrist extension Some mild pain on extensin No pain on finger extension CAn pass book test  RT hip TTP just below iliac crest Good hip abduction strength Good ROM of hip joint  US shows a spur There is a hypoechoic pseudobursa just below the spur Same as noted last visit

## 2014-01-21 ENCOUNTER — Ambulatory Visit: Payer: BC Managed Care – PPO | Admitting: Sports Medicine

## 2014-02-25 ENCOUNTER — Ambulatory Visit: Payer: BC Managed Care – PPO | Admitting: Sports Medicine

## 2014-03-13 ENCOUNTER — Encounter: Payer: Self-pay | Admitting: Sports Medicine

## 2014-03-13 ENCOUNTER — Ambulatory Visit (INDEPENDENT_AMBULATORY_CARE_PROVIDER_SITE_OTHER): Payer: BC Managed Care – PPO | Admitting: Sports Medicine

## 2014-03-13 VITALS — BP 101/67 | Ht 61.0 in | Wt 150.0 lb

## 2014-03-13 DIAGNOSIS — M2170 Unequal limb length (acquired), unspecified site: Secondary | ICD-10-CM

## 2014-03-13 DIAGNOSIS — M533 Sacrococcygeal disorders, not elsewhere classified: Secondary | ICD-10-CM

## 2014-03-13 NOTE — Assessment & Plan Note (Signed)
Patient instructed on how to the pretzel stretching home. She was also given a home stretching and strengthening exercise routine for both her low back as well as her pelvis and hips.

## 2014-03-13 NOTE — Assessment & Plan Note (Signed)
A medial heel wedge/lift was placed in her right shoe. This appears overcompensated and even her shoulders out significantly.

## 2014-03-13 NOTE — Progress Notes (Signed)
Patient ID: [name], female   DOB: 10/24/1966, 48 y.o.   MRN: [id number] 48 year old female with a history of right hip pain as well as right lateral epicondylitis presents today for followup as well as a new complaint of right lower back pain. She reports pain in the region of her right SI joint. This is worse with certain movements. She does notice the pain when playing tennis. The pain seems to decrease the flexibility and mobility within her right lower extremity according to her. The pain is also worse when running.  She reports overall her right lateral epicondylitis is markedly improved. She's able to play tennis with only occasional twinges of pain. She continues to do home exercise program and will continue to do so.  She reports that her right hip is feeling much better. She was diagnosed with gluteus medius/minimus syndrome and responded very well to an injection. She's had no pain since her last visit.  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 SI joint: Pain reproduced with pretzel stretch Tenderness to palpation of the right SI joint No evidence of a locked SI joint with pelvic rocking. No vertebral point tenderness Pain with FADIR testing Internal rotation limited with the left hip greater than the right.  Right lateral epicondyle of the elbow  no tenderness to palpation  full range of motion and full strength Negative ECRB testing  Leg length: Right leg is approximately 3/4 cm shorter

## 2014-09-04 ENCOUNTER — Encounter: Payer: Self-pay | Admitting: Sports Medicine

## 2014-09-04 ENCOUNTER — Ambulatory Visit (INDEPENDENT_AMBULATORY_CARE_PROVIDER_SITE_OTHER): Payer: BC Managed Care – PPO | Admitting: Sports Medicine

## 2014-09-04 ENCOUNTER — Ambulatory Visit: Payer: BC Managed Care – PPO | Admitting: Sports Medicine

## 2014-09-04 VITALS — BP 120/79 | Wt 152.0 lb

## 2014-09-04 DIAGNOSIS — M533 Sacrococcygeal disorders, not elsewhere classified: Secondary | ICD-10-CM

## 2014-09-04 DIAGNOSIS — M2170 Unequal limb length (acquired), unspecified site: Secondary | ICD-10-CM

## 2014-09-04 NOTE — Progress Notes (Signed)
  [name] - 48 y.o. female MRN [id number]  Date of birth: 22-Jan-1966  SUBJECTIVE:     [name] is a 48 yo F presenting with back pain. She was seen in April and found to have SI joint dysfunction.   She has recently started PT about two weeks ago. She has noticed mild improvement of her pain. Prior to starting PT, her pain had not changed since April.  The pain is occuring less frequent now. It still worsens with activity. She has quit playing tennis due to it exacerbating her pain.  She didn't experience pain while playing but it occurred afterwards.  Standing for a prolonged period of time can worsen it.  The pain occurs regardless of the type of shoes she wears. She denies any radiculopathy, numbness, tingling, weakness, fever, chills or night sweats.   ROS:     See HPI   OBJECTIVE: BP 120/79  Wt 152 lb (68.947 kg)  Physical Exam:  Vital signs are reviewed. General: Well appearing, NAD, alert  Back:  Palpation: tenderness of paraspinal muscles no, spinous process no; pelvis no  Flexion: full  Extension: exacerbates pain on right sided SI joint  TTP directly over RT SIJ  Hip:  Appearance: symmetric, no ecchymosis or erythema  Palpation: tenderness of greater trochanter no. TTP along right side of SI joint.  Rotation Reduced: internal no, external not reduced but some exacerbation of pain FADIR: neg FABER: some pain on right side   Neuro: Strength hip flexion 5/5, hip abduction 5/5, hip adduction 5/5,  knee extension 5/5, knee flexion 5/5, dorsiflexion 5/5, plantar flexion 5/5 Reflexes: patella 2/2 Bilateral   Sensation to light touch intact  No leg length discrepancy.   ASSESSMENT & PLAN:  See problem based charting & AVS for pt instructions.

## 2014-09-04 NOTE — Patient Instructions (Signed)
Find a couple of SI joint stretches that will unlock it if it becomes locked.   Didn't find any leg length discrepancy during exam today   Hip exercises to keep hip rotation and abduction strong.   Core exercises that can be tolerated without pain.   Work abdominal muscles and pelvic tilts to get better control of pelvis.

## 2014-09-04 NOTE — Assessment & Plan Note (Signed)
Pain most likely related to SI joint dysfunction. Improvement of her pain since starting PT. No leg length discrepancy today. Normal strength with hip abduction and hamstring testing. Patient was asking about MRI but see no reason for needing to order an imaging test now.  - continue PT  - continue stretches (pretzel) and exercise (focussing on hip rotation and core) - focusing on strengthening core and posture  - f/u as needed

## 2014-09-04 NOTE — Assessment & Plan Note (Signed)
On repeat measure with pelvis more flexible leg lengths appear pretty symmetric with minimal shortening on RT

## 2014-10-06 ENCOUNTER — Ambulatory Visit: Payer: BC Managed Care – PPO | Admitting: Podiatry

## 2014-10-16 ENCOUNTER — Ambulatory Visit: Payer: BC Managed Care – PPO | Admitting: Podiatry

## 2014-10-20 ENCOUNTER — Encounter: Payer: Self-pay | Admitting: Podiatry

## 2014-10-20 ENCOUNTER — Ambulatory Visit (INDEPENDENT_AMBULATORY_CARE_PROVIDER_SITE_OTHER): Payer: BC Managed Care – PPO | Admitting: Podiatry

## 2014-10-20 VITALS — BP 115/69 | HR 66 | Resp 16

## 2014-10-20 DIAGNOSIS — L600 Ingrowing nail: Secondary | ICD-10-CM

## 2014-10-20 NOTE — Progress Notes (Signed)
Subjective:     Patient ID: [name], female   DOB: 1965/11/16, 48 y.o.   MRN: [id number]  HPI patient has an ingrowing toenail right big toe lateral border that she states is been there long time and she's tried to trim it soak it and she cannot get out anymore   Review of Systems  All other systems reviewed and are negative.      Objective:   Physical Exam  Constitutional: She is oriented to person, place, and time.  Cardiovascular: Intact distal pulses.   Musculoskeletal: Normal range of motion.  Neurological: She is oriented to person, place, and time.  Skin: Skin is warm.  Nursing note and vitals reviewed.  neurovascular status intact with muscle strength adequate and range of motion of the subtalar and midtarsal joint within normal limits. Patient's noted to have good digital perfusion is well oriented 3 and had no equinus condition noted. Patient's right hallux lateral border is incurvated and sore with redness along the corner and no indications of active drainage     Assessment:     Ingrown toenail deformity right hallux with overall abnormal appearance of the nailbed    Plan:     H&P and x-ray reviewed. Today I infiltrated the right hallux 60 mg Xylocaine Marcaine mixture removed the lateral border exposed matrix and applied phenol 3 applications 30 seconds followed by alcohol lavaged and sterile dressing. Gave instructions on soaks and reappoint

## 2014-10-20 NOTE — Progress Notes (Signed)
   Subjective:    Patient ID: [name] [name] [name], female    DOB: [birth year], 48 y.o.   MRN: [id number]  HPI Comments: "I have an ingrown"  Patient c/o tender 1st toe right, lateral border, for about 1 year. She states there is a smaller piece of nail that grows out of the corner. Just keeps trimmed.  Toe Pain       Review of Systems  All other systems reviewed and are negative.      Objective:   Physical Exam        Assessment & Plan:

## 2014-10-20 NOTE — Patient Instructions (Signed)

## 2014-10-23 ENCOUNTER — Telehealth: Payer: Self-pay

## 2014-10-23 NOTE — Telephone Encounter (Signed)
Spoke with patient regarding ingrown nail removal procedure done on 10/20/14. She stated that she was still noticing drainage and some bleeding from area. I advised her that draining for 2-4 weeks is normal. Advised her to seek medical attention if toe becomes redden,swollen, warm and painful. Advised her that light exercising is ok, but no high impact exercising

## 2014-10-28 ENCOUNTER — Telehealth: Payer: Self-pay | Admitting: *Deleted

## 2014-10-28 NOTE — Telephone Encounter (Signed)
Pt complains of worsening redness running down into the foot from ingrown toenail procedure, of 10/20/2014.  Pt request an appt with Dr. Charlsie Merlesegal on 10/29/2014.  Pt states the toe is more sore to touch and to walk.  I transferred pt to schedulers and encouraged her to begin 1/4 C epsom salt to 1 qt warm water soaks for 20 minutes bid and cover with antibiotic ointment, until seen 10/29/2014.  Pt agreed.

## 2014-10-30 ENCOUNTER — Ambulatory Visit: Payer: BC Managed Care – PPO | Admitting: Podiatry

## 2014-11-05 ENCOUNTER — Encounter: Payer: Self-pay | Admitting: Podiatry

## 2014-11-05 ENCOUNTER — Ambulatory Visit (INDEPENDENT_AMBULATORY_CARE_PROVIDER_SITE_OTHER): Payer: BC Managed Care – PPO | Admitting: Podiatry

## 2014-11-05 VITALS — BP 99/56 | HR 53 | Resp 11

## 2014-11-05 DIAGNOSIS — L03031 Cellulitis of right toe: Secondary | ICD-10-CM

## 2014-11-05 NOTE — Progress Notes (Signed)
Subjective:     Patient ID: [name] [name], female   DOB: [birth year], 48 y.o.   MRN: [id number]  HPI patient presents stating check my ingrown toenail on my right big toe it has been red and crusting and the corner and has had occasional drainage   Review of Systems     Objective:   Physical Exam Neurovascular status intact with black scab tissue on the proximal portion the nailbed with localization of it and no proximal edema erythema or drainage noted    Assessment:     Localized paronychia infection right hallux lateral border with no proximal spread    Plan:     Using sterile curette debrided the area with no

## 2015-09-16 ENCOUNTER — Encounter: Payer: Self-pay | Admitting: Family Medicine

## 2015-09-16 ENCOUNTER — Ambulatory Visit (INDEPENDENT_AMBULATORY_CARE_PROVIDER_SITE_OTHER): Payer: BLUE CROSS/BLUE SHIELD | Admitting: Family Medicine

## 2015-09-16 VITALS — BP 123/68 | Ht 60.0 in | Wt 152.0 lb

## 2015-09-16 DIAGNOSIS — M25551 Pain in right hip: Secondary | ICD-10-CM | POA: Diagnosis not present

## 2015-09-16 MED ORDER — METHYLPREDNISOLONE ACETATE 40 MG/ML IJ SUSP
40.0000 mg | Freq: Once | INTRAMUSCULAR | Status: AC
  Administered 2015-09-16: 40 mg via INTRA_ARTICULAR

## 2015-09-16 NOTE — Assessment & Plan Note (Signed)
Secondary to most likely a combination of ischial bursitis along with high hamstring syndrome/hamstring tendinopathy Injection into the ischial bursa/hamstring origin region and under ultrasound guidance today. -Hamstring eccentric exercises along with stretching exercises given to patient today. -Follow-up in 3-4 weeks and if no improvement consider compression therapy along with formal physical therapy   Aspiration/Injection Procedure Note [name] [birth year]  Procedure: Injection Indications: R ischial bursa   Procedure Details Consent: Risks of procedure as well as the alternatives and risks of each were explained to the (patient/caregiver).  Consent for procedure obtained. Time Out: Verified patient identification, verified procedure, site/side was marked, verified correct patient position, special equipment/implants available, medications/allergies/relevent history reviewed, required imaging and test results available.  Performed.  The area was cleaned with iodine and alcohol swabs.    The R ischial bursa/ischial tuberosity was injected using 1 cc's of 40mg  Depomedrol and 1 cc's of 1% lidocaine with a spinal needle.  Ultrasound was used. Images were obtained in Transverse and Long views showing the injection.    A sterile dressing was applied.  Patient did tolerate procedure well. Estimated blood loss: None

## 2015-09-16 NOTE — Progress Notes (Signed)
  [name] - 49 y.o. female MRN [id number]  Date of birth: [birth year]  SUBJECTIVE:  Including CC & ROS.  [name] is a 49 y.o. female who presents today for R posterior hip pain.    Hip Pain right, follow-up visit - patient presents today for right posterior hip pain located in the medial aspect of her gluteal region. It is worse when sitting for prolonged periods of time or when stretching the hamstring region. She also does feel it with prolonged running. She has previously tried pretzel stretches and exercises with her therapist. She has not tried medications for this at this time. No paresthesias going down her leg.  PMHx - Updated and reviewed.  Contributory factors include: Gluteus medius syndrome PSHx - Updated and reviewed.  Contributory factors include:  Noncontributory FHx - Updated and reviewed.  Contributory factors include:  Noncontributory Medications - denies other than medications on her list   12 point ROS negative other than per HPI.   Exam:  Filed Vitals:   09/16/15 1505  BP: 123/68    Gen: NAD Cardiorespiratory - Normal respiratory effort/rate.  RRR Hip Exam:  Pelvic alignment unremarkable to inspection and palpation. Standing hip rotation and gait without trendelenburg / unsteadiness. Greater trochanter without tenderness to palpation. No tenderness over piriformis and greater trochanter. No SI joint tenderness and normal minimal SI movement. ROM: IR: 80 Deg, ER: 80 Deg, Flexion: 120 Deg, Extension: 100 Deg, Abduction: 45 Deg, Adduction: 45 Deg Strength:  IR: 5/5, ER: 5/5, Flexion (0 and 90 degrees): 5/5, Extension: 5/5, Abduction: 5/5, Adduction: 5/5 Negative Thomas test  Negative FADIR.  Negative FADIR with axial compression Negative FAIR and Freiberg  Negative FABER in all directions, negative posterior shear, negative Gaenslen  Negative Hop Test and Fulcrum  Negative Noble and Ober testing  + TTP at ischial bursa/IT   Neurovascularly  intact B/L LE  Imaging: US of the posterior thigh showing normal piriformis without any hypoechoic/calcification of the muscle belly or tendon. There is a small amount of fluid in the ischial bursa superficial to the ischial tuberosity. There appears to be one small osteophyte/calcification at the medial aspect of the ischial tuberosity with irritation of the hamstring tendon origin. However the muscle and tendon of the hamstring appears normal.

## 2016-04-22 DIAGNOSIS — K1121 Acute sialoadenitis: Secondary | ICD-10-CM | POA: Diagnosis not present

## 2016-04-22 DIAGNOSIS — K130 Diseases of lips: Secondary | ICD-10-CM | POA: Diagnosis not present

## 2016-05-30 DIAGNOSIS — H10413 Chronic giant papillary conjunctivitis, bilateral: Secondary | ICD-10-CM | POA: Diagnosis not present

## 2016-06-16 DIAGNOSIS — M79604 Pain in right leg: Secondary | ICD-10-CM | POA: Diagnosis not present

## 2016-06-16 DIAGNOSIS — M62838 Other muscle spasm: Secondary | ICD-10-CM | POA: Diagnosis not present

## 2016-06-16 DIAGNOSIS — R278 Other lack of coordination: Secondary | ICD-10-CM | POA: Diagnosis not present

## 2016-06-16 DIAGNOSIS — M545 Low back pain: Secondary | ICD-10-CM | POA: Diagnosis not present

## 2016-06-16 DIAGNOSIS — N393 Stress incontinence (female) (male): Secondary | ICD-10-CM | POA: Diagnosis not present

## 2016-06-16 DIAGNOSIS — R102 Pelvic and perineal pain: Secondary | ICD-10-CM | POA: Diagnosis not present

## 2016-06-22 DIAGNOSIS — Z1211 Encounter for screening for malignant neoplasm of colon: Secondary | ICD-10-CM | POA: Diagnosis not present

## 2016-06-22 DIAGNOSIS — D122 Benign neoplasm of ascending colon: Secondary | ICD-10-CM | POA: Diagnosis not present

## 2016-06-22 DIAGNOSIS — Z800 Family history of malignant neoplasm of digestive organs: Secondary | ICD-10-CM | POA: Diagnosis not present

## 2016-06-22 DIAGNOSIS — D126 Benign neoplasm of colon, unspecified: Secondary | ICD-10-CM | POA: Diagnosis not present

## 2016-06-22 DIAGNOSIS — D125 Benign neoplasm of sigmoid colon: Secondary | ICD-10-CM | POA: Diagnosis not present

## 2016-06-22 DIAGNOSIS — K5730 Diverticulosis of large intestine without perforation or abscess without bleeding: Secondary | ICD-10-CM | POA: Diagnosis not present

## 2016-08-29 DIAGNOSIS — L638 Other alopecia areata: Secondary | ICD-10-CM | POA: Diagnosis not present

## 2016-08-31 ENCOUNTER — Ambulatory Visit (INDEPENDENT_AMBULATORY_CARE_PROVIDER_SITE_OTHER): Payer: BLUE CROSS/BLUE SHIELD | Admitting: Physician Assistant

## 2016-08-31 DIAGNOSIS — M461 Sacroiliitis, not elsewhere classified: Secondary | ICD-10-CM | POA: Diagnosis not present

## 2016-08-31 DIAGNOSIS — M545 Low back pain: Secondary | ICD-10-CM | POA: Diagnosis not present

## 2016-09-08 ENCOUNTER — Other Ambulatory Visit (INDEPENDENT_AMBULATORY_CARE_PROVIDER_SITE_OTHER): Payer: Self-pay | Admitting: Orthopaedic Surgery

## 2016-09-09 ENCOUNTER — Other Ambulatory Visit (INDEPENDENT_AMBULATORY_CARE_PROVIDER_SITE_OTHER): Payer: Self-pay | Admitting: Orthopaedic Surgery

## 2016-09-09 DIAGNOSIS — M545 Low back pain, unspecified: Secondary | ICD-10-CM

## 2016-09-12 DIAGNOSIS — M545 Low back pain: Secondary | ICD-10-CM | POA: Diagnosis not present

## 2016-09-12 DIAGNOSIS — M5416 Radiculopathy, lumbar region: Secondary | ICD-10-CM | POA: Diagnosis not present

## 2016-09-12 DIAGNOSIS — M25551 Pain in right hip: Secondary | ICD-10-CM | POA: Diagnosis not present

## 2016-09-12 DIAGNOSIS — M25529 Pain in unspecified elbow: Secondary | ICD-10-CM | POA: Diagnosis not present

## 2016-09-14 ENCOUNTER — Ambulatory Visit
Admission: RE | Admit: 2016-09-14 | Discharge: 2016-09-14 | Disposition: A | Discharge status: Home / Self Care | Payer: BLUE CROSS/BLUE SHIELD | Source: Ambulatory Visit | Attending: Orthopaedic Surgery | Admitting: Orthopaedic Surgery

## 2016-09-14 DIAGNOSIS — M5126 Other intervertebral disc displacement, lumbar region: Secondary | ICD-10-CM | POA: Diagnosis not present

## 2016-09-14 DIAGNOSIS — M545 Low back pain, unspecified: Secondary | ICD-10-CM

## 2016-09-21 ENCOUNTER — Ambulatory Visit (INDEPENDENT_AMBULATORY_CARE_PROVIDER_SITE_OTHER): Payer: BLUE CROSS/BLUE SHIELD | Admitting: Orthopaedic Surgery

## 2016-09-21 DIAGNOSIS — M5431 Sciatica, right side: Secondary | ICD-10-CM | POA: Diagnosis not present

## 2016-09-21 NOTE — Progress Notes (Signed)
`   Ms. [name] comes in today for follow-up after MRI of her lumbar spine. She's been having low back pain with radicular component going down her right backside. This does radiate to her knee on occasion and she has a "funny feeling" going on the side of her right leg. She denies any change in bowel or bladder function and denies any leg weakness. Again this is been going on for over a year. We sent her for an MRI and due to continued radicular symptoms. I  She still has a positive straight leg raise on the right side and subjective numbness in L4 and L5 distribution with no weakness.  MRI is reviewed with her and I gave her a copy of her MRI report. It does show a synovial cyst to the right side at the L3 L4 facet joint and looks like it is likely contacting the L4 and potentially L5 nerve root to the right. I then showed these MRI findings to Dr. Alvester MorinNewton. He is recommended to start a facet joint injection at the right L3-L4 with an attempt to potentially aspirate the cyst. I talked to Ms. [name] about this and having the set up and she's definitely shouldn't having this done. We will work on having an appointment set up with Dr. Alvester MorinNewton I will see her back myself in 4 weeks had her intervention at that point.

## 2016-09-22 ENCOUNTER — Other Ambulatory Visit (INDEPENDENT_AMBULATORY_CARE_PROVIDER_SITE_OTHER): Payer: Self-pay

## 2016-09-22 DIAGNOSIS — M545 Low back pain: Secondary | ICD-10-CM

## 2016-10-03 DIAGNOSIS — L638 Other alopecia areata: Secondary | ICD-10-CM | POA: Diagnosis not present

## 2016-10-10 DIAGNOSIS — M7130 Other bursal cyst, unspecified site: Secondary | ICD-10-CM | POA: Diagnosis not present

## 2016-10-10 DIAGNOSIS — Z6830 Body mass index (BMI) 30.0-30.9, adult: Secondary | ICD-10-CM | POA: Diagnosis not present

## 2016-10-13 DIAGNOSIS — L638 Other alopecia areata: Secondary | ICD-10-CM | POA: Diagnosis not present

## 2016-10-19 ENCOUNTER — Ambulatory Visit (INDEPENDENT_AMBULATORY_CARE_PROVIDER_SITE_OTHER): Payer: BLUE CROSS/BLUE SHIELD | Admitting: Orthopaedic Surgery

## 2016-10-20 ENCOUNTER — Ambulatory Visit (INDEPENDENT_AMBULATORY_CARE_PROVIDER_SITE_OTHER): Payer: BLUE CROSS/BLUE SHIELD | Admitting: Physical Medicine and Rehabilitation

## 2016-10-20 ENCOUNTER — Encounter (INDEPENDENT_AMBULATORY_CARE_PROVIDER_SITE_OTHER): Payer: Self-pay | Admitting: Physical Medicine and Rehabilitation

## 2016-10-20 VITALS — BP 108/65 | HR 53

## 2016-10-20 DIAGNOSIS — G8929 Other chronic pain: Secondary | ICD-10-CM | POA: Diagnosis not present

## 2016-10-20 DIAGNOSIS — M47816 Spondylosis without myelopathy or radiculopathy, lumbar region: Secondary | ICD-10-CM

## 2016-10-20 DIAGNOSIS — M5441 Lumbago with sciatica, right side: Secondary | ICD-10-CM | POA: Diagnosis not present

## 2016-10-20 MED ORDER — LIDOCAINE HCL (PF) 1 % IJ SOLN: 0.3300 mL | Freq: Once | INTRAMUSCULAR | Status: AC

## 2016-10-20 MED ORDER — METHYLPREDNISOLONE ACETATE 80 MG/ML IJ SUSP
80.0000 mg | Freq: Once | INTRAMUSCULAR | Status: AC
  Administered 2016-10-21: 80 mg

## 2016-10-20 NOTE — Progress Notes (Signed)
[name] - 50 y.o. female MRN [id number]  Date of birth: 14-Aug-1966  Office Visit Note: Visit Date: 10/20/2016 PCP: Martha ClanShaw, William, MD Referred by: Martha ClanShaw, William, MD  Subjective: Chief Complaint  Patient presents with  . Lower Back - Pain   HPI: [name] is a 50 year old female with chronic worsening severe low back pain on right side with pain referring down right leg to ankle. Worse with exercising and driving. Numbness and tingling down leg to foot. Her symptoms seem to be more of a classic L5 distribution. She has an MRI showing facet joint cyst on the right into the lateral recess. It is likely affecting either the L4-L5 nerve root. Is not a very large cyst but he thinks is causing her symptoms. She actually has seen Dr. Marikay Alaravid Jones since she saw Dr. Magnus IvanBlackman and is scheduled for surgery in 2 weeks. She does want to go ahead and go through with right L3-4 facet joint aspiration and injection. She did discuss this with Dr. Yetta BarreJones.    ROS Otherwise per HPI.  Assessment & Plan: Visit Diagnoses:  1. Spondylosis without myelopathy or radiculopathy, lumbar region   2. Chronic right-sided low back pain with right-sided sciatica     Plan: Findings:  Right L3-4 facet joint injection and aspiration with fluoroscopic guidance.    Meds & Orders:  Meds ordered this encounter  Medications  . lidocaine (PF) (XYLOCAINE) 1 % injection 0.3 mL  . methylPREDNISolone acetate (DEPO-MEDROL) injection 80 mg    Orders Placed This Encounter  Procedures  . Nerve Block    Follow-up: Return for Scheduled follow-up with Dr. Marikay Alaravid Jones.   Procedures: No procedures performed  Lumbar Facet Joint Intra-Articular Injection(s) with Fluoroscopic Guidance  [name]      Date of Birth: 14-Aug-1966 MRN: [id number] PCP: Martha ClanShaw, William, MD      Visit Date: 10/20/2016   Universal Protocol:    Date/Time: 12/07/171:39 PM  Consent Given By: the patient  Position: PRONE    Additional Comments: Vital signs were monitored before and after the procedure. Patient was prepped and draped in the usual sterile fashion. The correct patient, procedure, and site was verified.   Injection Procedure Details:  Procedure Site One Meds Administered:  Meds ordered this encounter  Medications  . lidocaine (PF) (XYLOCAINE) 1 % injection 0.3 mL  . methylPREDNISolone acetate (DEPO-MEDROL) injection 80 mg     Laterality: Right  Location/Site:  L3-L4  Needle size: 22 guage  Needle type: Spinal  Needle Placement: Articular  Findings:  -Contrast Used: 1 mL iohexol 180 mg iodine/mL   -Comments: There was excellent flow of contrast producing a partial arthrogram of the joint. Aspiration did yield a very small amount of normal-appearing joint fluid  Procedure Details: The fluoroscope beam is vertically oriented in AP, and the inferior recess is visualized beneath the lower pole of the inferior apophyseal process, which represents the target point for needle insertion. When direct visualization is difficult the target point is located at the medial projection of the vertebral pedicle. The region overlying each aforementioned target is locally anesthetized with a 1 to 2 ml. volume of 1% Lidocaine without Epinephrine.   The spinal needle was inserted into each of the above mentioned facet joints using biplanar fluoroscopic guidance. A 0.25 to 0.5 ml. volume of Isovue-250 was injected and a partial facet joint arthrogram was obtained. A single spot film was obtained of the resulting arthrogram.    One to 1.25 ml  of the steroid/anesthetic solution was then injected into each of the facet joints noted above.   Additional Comments:  The patient tolerated the procedure well No complications occurred Dressing: Band-Aid and 2x2 sterile gauze     Post-procedure details: Patient was observed during the procedure. Post-procedure instructions were reviewed.  Patient left the  clinic in stable condition.        Clinical History: Lumbar spine dated 09/14/2016 IMPRESSION: The dominant RIGHT-sided abnormality is at L3-4, where a small 3 x 4 mm synovial cyst projects medially from the RIGHT facet joint and compresses the RIGHT L4 nerve root in the subarticular zone.  Central protrusion L4-5, also with mild facet arthropathy, not clearly compressive.  She reports that she has never smoked. She does not have any smokeless tobacco history on file. No results for input(s): HGBA1C, LABURIC in the last 8760 hours.  Objective:  VS:  HT:    WT:   BMI:     BP:108/65  HR:(!) 53bpm  TEMP: ( )  RESP:100 % Physical Exam  Musculoskeletal:  The patient ambulates without aid. She is somewhat of an antalgic gait to the right. She has normal distal strength.    Ortho Exam Imaging: No results found.  Past Medical/Family/Surgical/Social History: Medications & Allergies reviewed per EMR Patient Active Problem List   Diagnosis Date Noted  . Right-sided ischial pain 09/16/2015  . Sacroiliac pain 03/13/2014  . Leg length discrepancy 03/13/2014  . Gluteus medius or minimus syndrome 09/12/2013  . Lateral epicondylitis of right elbow 09/12/2013  . DEPRESSIVE DISORDER NOT ELSEWHERE CLASSIFIED 10/20/2010   History reviewed. No pertinent past medical history. History reviewed. No pertinent family history. History reviewed. No pertinent surgical history. Social History   Occupational History  . Not on file.   Social History Main Topics  . Smoking status: Never Smoker  . Smokeless tobacco: Not on file  . Alcohol use Not on file  . Drug use: Unknown  . Sexual activity: Not on file

## 2016-10-20 NOTE — Patient Instructions (Signed)

## 2016-10-20 NOTE — Procedures (Signed)
Lumbar Facet Joint Intra-Articular Injection(s) with Fluoroscopic Guidance  [name]      Date of Birth: 04-May-1966 MRN: [id number] PCP: Martha ClanShaw, William, MD      Visit Date: 10/20/2016   Universal Protocol:    Date/Time: 12/07/171:39 PM  Consent Given By: the patient  Position: PRONE   Additional Comments: Vital signs were monitored before and after the procedure. Patient was prepped and draped in the usual sterile fashion. The correct patient, procedure, and site was verified.   Injection Procedure Details:  Procedure Site One Meds Administered:  Meds ordered this encounter  Medications  . lidocaine (PF) (XYLOCAINE) 1 % injection 0.3 mL  . methylPREDNISolone acetate (DEPO-MEDROL) injection 80 mg     Laterality: Right  Location/Site:  L3-L4  Needle size: 22 guage  Needle type: Spinal  Needle Placement: Articular  Findings:  -Contrast Used: 1 mL iohexol 180 mg iodine/mL   -Comments: There was excellent flow of contrast producing a partial arthrogram of the joint. Aspiration did yield a very small amount of normal-appearing joint fluid  Procedure Details: The fluoroscope beam is vertically oriented in AP, and the inferior recess is visualized beneath the lower pole of the inferior apophyseal process, which represents the target point for needle insertion. When direct visualization is difficult the target point is located at the medial projection of the vertebral pedicle. The region overlying each aforementioned target is locally anesthetized with a 1 to 2 ml. volume of 1% Lidocaine without Epinephrine.   The spinal needle was inserted into each of the above mentioned facet joints using biplanar fluoroscopic guidance. A 0.25 to 0.5 ml. volume of Isovue-250 was injected and a partial facet joint arthrogram was obtained. A single spot film was obtained of the resulting arthrogram.    One to 1.25 ml of the steroid/anesthetic solution was then injected into  each of the facet joints noted above.   Additional Comments:  The patient tolerated the procedure well No complications occurred Dressing: Band-Aid and 2x2 sterile gauze     Post-procedure details: Patient was observed during the procedure. Post-procedure instructions were reviewed.  Patient left the clinic in stable condition.

## 2016-10-21 DIAGNOSIS — G8929 Other chronic pain: Secondary | ICD-10-CM | POA: Diagnosis not present

## 2016-10-21 DIAGNOSIS — M47816 Spondylosis without myelopathy or radiculopathy, lumbar region: Secondary | ICD-10-CM | POA: Diagnosis not present

## 2016-10-21 DIAGNOSIS — M5441 Lumbago with sciatica, right side: Secondary | ICD-10-CM | POA: Diagnosis not present

## 2016-10-28 DIAGNOSIS — Z01812 Encounter for preprocedural laboratory examination: Secondary | ICD-10-CM | POA: Diagnosis not present

## 2016-10-28 DIAGNOSIS — M7130 Other bursal cyst, unspecified site: Secondary | ICD-10-CM | POA: Diagnosis not present

## 2016-11-03 DIAGNOSIS — M5416 Radiculopathy, lumbar region: Secondary | ICD-10-CM | POA: Diagnosis not present

## 2016-11-03 DIAGNOSIS — M47816 Spondylosis without myelopathy or radiculopathy, lumbar region: Secondary | ICD-10-CM | POA: Diagnosis not present

## 2016-11-03 DIAGNOSIS — M7138 Other bursal cyst, other site: Secondary | ICD-10-CM | POA: Diagnosis not present

## 2016-11-10 DIAGNOSIS — L638 Other alopecia areata: Secondary | ICD-10-CM | POA: Diagnosis not present

## 2016-11-21 DIAGNOSIS — M545 Low back pain: Secondary | ICD-10-CM | POA: Diagnosis not present

## 2016-12-02 DIAGNOSIS — M545 Low back pain: Secondary | ICD-10-CM | POA: Diagnosis not present

## 2016-12-15 DIAGNOSIS — L638 Other alopecia areata: Secondary | ICD-10-CM | POA: Diagnosis not present

## 2017-01-04 DIAGNOSIS — M545 Low back pain: Secondary | ICD-10-CM | POA: Diagnosis not present

## 2017-01-17 DIAGNOSIS — Z1231 Encounter for screening mammogram for malignant neoplasm of breast: Secondary | ICD-10-CM | POA: Diagnosis not present

## 2017-02-10 DIAGNOSIS — R439 Unspecified disturbances of smell and taste: Secondary | ICD-10-CM | POA: Diagnosis not present

## 2017-02-10 DIAGNOSIS — J324 Chronic pansinusitis: Secondary | ICD-10-CM | POA: Diagnosis not present

## 2017-02-24 DIAGNOSIS — R439 Unspecified disturbances of smell and taste: Secondary | ICD-10-CM | POA: Diagnosis not present

## 2017-02-24 DIAGNOSIS — J324 Chronic pansinusitis: Secondary | ICD-10-CM | POA: Diagnosis not present

## 2017-03-16 DIAGNOSIS — R071 Chest pain on breathing: Secondary | ICD-10-CM | POA: Diagnosis not present

## 2017-03-16 DIAGNOSIS — F419 Anxiety disorder, unspecified: Secondary | ICD-10-CM | POA: Diagnosis not present

## 2017-03-16 DIAGNOSIS — R05 Cough: Secondary | ICD-10-CM | POA: Diagnosis not present

## 2017-03-16 DIAGNOSIS — K589 Irritable bowel syndrome without diarrhea: Secondary | ICD-10-CM | POA: Diagnosis not present

## 2017-03-27 DIAGNOSIS — J209 Acute bronchitis, unspecified: Secondary | ICD-10-CM | POA: Diagnosis not present

## 2017-03-27 DIAGNOSIS — Z6829 Body mass index (BMI) 29.0-29.9, adult: Secondary | ICD-10-CM | POA: Diagnosis not present

## 2017-03-27 DIAGNOSIS — R062 Wheezing: Secondary | ICD-10-CM | POA: Diagnosis not present

## 2017-04-13 DIAGNOSIS — Z6829 Body mass index (BMI) 29.0-29.9, adult: Secondary | ICD-10-CM | POA: Diagnosis not present

## 2017-04-13 DIAGNOSIS — Z01419 Encounter for gynecological examination (general) (routine) without abnormal findings: Secondary | ICD-10-CM | POA: Diagnosis not present

## 2017-06-15 DIAGNOSIS — M25521 Pain in right elbow: Secondary | ICD-10-CM | POA: Diagnosis not present

## 2017-06-27 ENCOUNTER — Telehealth: Payer: Self-pay | Admitting: Genetic Counselor

## 2017-06-27 NOTE — Telephone Encounter (Signed)
LM on VM that I was calling to set up an appointment from a referral from Dr. Huel Coventryomblin's office.  [facility]

## 2017-06-30 ENCOUNTER — Telehealth: Payer: Self-pay | Admitting: Genetic Counselor

## 2017-06-30 NOTE — Telephone Encounter (Signed)
LM on VM that Dr. Huel Coventry office referred her for [facility] and that I was calling to schedule an appointment.  Asked that she CB.

## 2017-07-13 ENCOUNTER — Encounter: Payer: Self-pay | Admitting: Genetic Counselor

## 2017-07-14 DIAGNOSIS — M7742 Metatarsalgia, left foot: Secondary | ICD-10-CM | POA: Diagnosis not present

## 2017-07-14 DIAGNOSIS — M7711 Lateral epicondylitis, right elbow: Secondary | ICD-10-CM | POA: Diagnosis not present

## 2017-07-19 ENCOUNTER — Encounter: Payer: Self-pay | Admitting: Genetic Counselor

## 2017-07-19 ENCOUNTER — Other Ambulatory Visit: Payer: BLUE CROSS/BLUE SHIELD

## 2017-07-19 ENCOUNTER — Ambulatory Visit (HOSPITAL_BASED_OUTPATIENT_CLINIC_OR_DEPARTMENT_OTHER): Payer: BLUE CROSS/BLUE SHIELD | Admitting: Genetic Counselor

## 2017-07-19 DIAGNOSIS — Z836 Family history of other diseases of the respiratory system: Secondary | ICD-10-CM | POA: Insufficient documentation

## 2017-07-19 DIAGNOSIS — Z800 Family history of malignant neoplasm of digestive organs: Secondary | ICD-10-CM | POA: Diagnosis not present

## 2017-07-19 DIAGNOSIS — Z7183 Encounter for nonprocreative genetic counseling: Secondary | ICD-10-CM

## 2017-07-19 DIAGNOSIS — Z808 Family history of malignant neoplasm of other organs or systems: Secondary | ICD-10-CM | POA: Insufficient documentation

## 2017-07-19 DIAGNOSIS — M7711 Lateral epicondylitis, right elbow: Secondary | ICD-10-CM | POA: Diagnosis not present

## 2017-07-19 DIAGNOSIS — Z803 Family history of malignant neoplasm of breast: Secondary | ICD-10-CM

## 2017-07-19 NOTE — Progress Notes (Addendum)
REFERRING PROVIDER: Juanda Chance, NP [address] [place],  [address]  PRIMARY PROVIDER:  Marton Redwood, MD  PRIMARY REASON FOR VISIT:  1. Family history of colon cancer in mother   2. Family history of pancreatic cancer   3. Family history of breast cancer   4. Family history of melanoma   5. Family history of pulmonary fibrosis      HISTORY OF PRESENT ILLNESS:   [name], a 51 y.o. female, was seen for a [facility] cancer genetics consultation at the request of Sharon Chance, NP due to a family history of cancer.  [name] presents to clinic today to discuss the possibility of a hereditary predisposition to cancer, genetic testing, and to further clarify her future cancer risks, as well as potential cancer risks for family members.  [name] is a 51 y.o. female with no personal history of cancer.  Her mother and maternal aunt underwent genetic testing for their diagnosis of colon cancer.  Her mother was tested in 2014 and her aunt in 2016. The patient's mother was negative for any hereditary mutations, while her aunt had an MSH6 VUS.  CANCER HISTORY:   No history exists.     HORMONAL RISK FACTORS:  Menarche was at age 68.  First live birth at age 11.  OCP use for approximately 10-11 years.  Ovaries intact: yes.  Hysterectomy: no.  Menopausal status: perimenopausal.  HRT use: 0 years. Colonoscopy: yes; normal. Mammogram within the last year: yes. Number of breast biopsies: 0. Up to date with pelvic exams:  yes. Any excessive radiation exposure in the past:  no  Past Medical History:  Diagnosis Date  . Family history of breast cancer   . Family history of colon cancer in mother   . Family history of melanoma   . Family history of pancreatic cancer   . Family history of pulmonary fibrosis     History reviewed. No pertinent surgical history.  Social History   Social History  . Marital status: Married    Spouse name: N/A  . Number of  children: N/A  . Years of education: N/A   Social History Main Topics  . Smoking status: Never Smoker  . Smokeless tobacco: Never Used  . Alcohol use Yes     Comment: 3-4/week  . Drug use: No  . Sexual activity: Not Asked   Other Topics Concern  . None   Social History Narrative  . None     FAMILY HISTORY:  We obtained a detailed, 4-generation family history.  Significant diagnoses are listed below: Family History  Problem Relation Age of Onset  . Colon cancer Mother 67       Negative [facility] MyRisk testing in 2014  . Pulmonary fibrosis Mother 29  . Colon polyps Mother        10-20 colon polyps  . Pancreatic cancer Father 3  . Other Brother 23       car accident  . Colon cancer Maternal Aunt 71       MSH6 VUS found on [employer] panel in 2016  . Pulmonary fibrosis Maternal Uncle 74       had some testing in [place]  . Pulmonary fibrosis Maternal Grandfather        died in his 84s  . Breast cancer Paternal Grandmother        dx in her 65s  . Heart disease Paternal Grandfather   . Lung cancer Maternal Aunt   .  COPD Maternal Uncle   . Schizophrenia Maternal Uncle   . Breast cancer Other        MGMs sister dx in her 54s  . Breast cancer Other        MGMs sister dx in her 62s  . Breast cancer Cousin        two of mother's maternal cousins dx in their 44s  . Melanoma Other        MGF's brother  . Pulmonary fibrosis Other        4 of MGF's brothers    The patient has two daughters who are cancer free.  She has one sister and one brother.  Her brother died at 27 from a car accident.  Her sister reportedly has less than 10 colon polyps.  The patient's father died of pancreatic cancer at 9.  He has one sister who is living at 14.  Both of his parents are deceased.  His mother had breast cancer in her 46's and died at 34.  His father died from heart disease.  The patient's mother developed colon cancer at 68.  She had polyposis with 10-20 colon polyps during her  lifetime.  She developed pulmonary fibrosis at age 58 and died at 9.  She has two sisters and two brothers.  One brother died of COPD and a sister died of lung cancer.  Another sister developed colon cancer and was found to have a VUS on her genetic testing.  A brother has been diagnosed with pulmonary fibrosis in [place].  He reportedly has undergone some type of testing, although the patient is not sure if it was genetic testing vs confirmation testing of pulmonary fibrosis.  The sister with colon cancer has also done this and is negative.  The maternal grandparents are deceased.  The grandmother died of old age, but she had two sisters with breast cancer, and one of them had two daughters with breast cancer.  There were 6 other siblings, and one had a son with prostate cancer.  The grandfather died of pulmonary fibrosis.  He had a brother who died of melanoma, four brothers who died of pulmonary fibrosis and three sisters who died at later ages.  Patient's maternal ancestors are of [place], [place] and [place] descent, and paternal ancestors are of [place] descent. There is no reported Ashkenazi Jewish ancestry. There is no known consanguinity.  GENETIC COUNSELING ASSESSMENT: [name] is a 51 y.o. female with a family history of breast, pancreatic, and colon cancer, as well as melanoma and pulmonary fibrosis which is somewhat suggestive of a hereditary cancer and pulmonary fibrosis syndrome and predisposition to cancer and/or pulmonary fibrosis. We, therefore, discussed and recommended the following at today's visit.   DISCUSSION: We discussed that about 5-6% of colon cancer is hereditary, with most cases due to Lynch syndrome.  The patient's mother tested negative for Lynch syndrome in 2014, but this was before the [place] mutation was routinely tested, and therefore this could have been missed.  Since 2014, there are at least 5 other genes that have been associated with an increased risk for  colon cancer, that her mother was not tested for.  Additionally, her father had pancreatic cancer and his mother had breast cancer.  Pancreatic cancer has recently been added to NCCN as a cancer that should undergo genetic testing for BRCA mutations. The combination of breast and pancreatic cancer in the family makes [place] suspicious for a hereditary mutation in one of 4 genes,  including BRCA1, BRCA2, PALB2 and ATM.    We also discussed Pulmonary Fibrosis.  Pulmonary fibrosis is a group of lung diseases characterized by development of scarring in the lungs.  There are several different subtypes of pulmonary fibrosis, including idiopathic pulmonary fibrosis (IPF), with different causes that are defined based on their appearance on imaging tests like a CT scan or how lung tissue looks under the microscope after a lung biopsy.  The incidence of pulmonary fibrosis in the general population is approximately 6-16/100,000 people in the [place].     Familial Pulmonary Fibrosis (FPF) is defined by having two or more first degree relatives (parent, sibling or child) in a family with a diagnosis of pulmonary fibrosis.  In FPF, there is evidence that an underlying inherited genetic component may contribute to the development of pulmonary fibrosis, but environmental factors may also contribute. At present, several genes are known to be associated with FPF and account for approximately 20-25% of FPF cases.  Thus, additional genes remain to be discovered.     Approximately 20% of patients with FPF have a change (or mutation) in one of their telomerase genes, including TERT, TERC, RTEL1, and PARN.  Individuals with mutations in these genes may also be at risk for other health conditions such as low blood counts, early gray hair, or liver disease.  Rarely, FPF is due to mutations in other genes such as SFTPC or SFTPA2.  The inheritance pattern in FPF is unclear and may vary from family to family, but autosomal dominance with reduced  penetrance appears most likely.  This means that it only takes one copy of the genetic factor inherited from one parent to have risk for the condition and there is a 50/50 Jacobs of inheriting that genetic factor.  However, reduced penetrance means that not all individuals who inherit the genetic factor will develop symptoms or the disease.    We reviewed the characteristics, features and inheritance patterns of hereditary cancer syndromes. We also discussed genetic testing, including the appropriate family members to test, the process of testing, insurance coverage and turn-around-time for results. We discussed the implications of a negative, positive and/or variant of uncertain significant result. We recommended [name] pursue genetic testing for the [facility] panel. The Multi-Gene Panel offered by [facility] includes sequencing and/or deletion duplication testing of the following 80 genes: ALK, APC, ATM, AXIN2,BAP1,  BARD1, BLM, BMPR1A, BRCA1, BRCA2, BRIP1, CASR, CDC73, CDH1, CDK4, CDKN1B, CDKN1C, CDKN2A (p14ARF), CDKN2A (p16INK4a), CEBPA, CHEK2, CTNNA1, DICER1, DIS3L2, EGFR (c.2369C>T, p.Thr790Met variant only), EPCAM (Deletion/duplication testing only), FH, FLCN, GATA2, GPC3, GREM1 (Promoter region deletion/duplication testing only), HOXB13 (c.251G>A, p.Gly84Glu), HRAS, KIT, MAX, MEN1, MET, MITF (c.952G>A, p.Glu318Lys variant only), MLH1, MSH2, MSH3, MSH6, MUTYH, NBN, NF1, NF2, NTHL1, PALB2, PDGFRA, PHOX2B, PMS2, POLD1, POLE, POT1, PRKAR1A, PTCH1, PTEN, RAD50, RAD51C, RAD51D, RB1, RECQL4, RET, RUNX1, SDHAF2, SDHA (sequence changes only), SDHB, SDHC, SDHD, SMAD4, SMARCA4, SMARCB1, SMARCE1, STK11, SUFU, TERT, TERT, TMEM127, TP53, TSC1, TSC2, VHL, WRN and WT1.    Based on [name] family history of cancer, she meets medical criteria for genetic testing. Despite that she meets criteria, she may still have an out of pocket cost. We discussed that if her out of pocket cost for testing is over $100, the  laboratory will call and confirm whether she wants to proceed with testing.  If the out of pocket cost of testing is less than $100 she will be billed by the genetic testing laboratory.   PLAN: After considering the risks, benefits,  and limitations, [name]  provided informed consent to pursue genetic testing and the blood sample was sent to [facility] for analysis of the Multi-gene panel. Results should be available within approximately 2-3 weeks' time, at which point they will be disclosed by telephone to [name], as will any additional recommendations warranted by these results. [name] will receive a summary of her genetic counseling visit and a copy of her results once available. This information will also be available in Epic. We encouraged [name] to remain in contact with cancer genetics annually so that we can continuously update the family history and inform her of any changes in cancer genetics and testing that may be of benefit for her family. [name] questions were answered to her satisfaction today. Our contact information was provided should additional questions or concerns arise.  [name] is going to learn more about the program in [place] that her aunt and uncle were tested in, and get back to me.  She is unclear if testing consisted of genetic testing or if it was simply screening for Pulmonary Fibrosis.  If it did include genetic testing, and there is a known mutation in the family, we could offer her genetic testing through our program.  If this is a family study in [place], then [name] would probably want to go through [place]'s program.   Lastly, we encouraged [name] to remain in contact with cancer genetics annually so that we can continuously update the family history and inform her of any changes in cancer genetics and testing that may be of benefit for this family.   Ms.  Jacobs questions were answered to her  satisfaction today. Our contact information was provided should additional questions or concerns arise. Thank you for the referral and allowing [place] to share in the care of your patient.   Karen P. Florene Glen, [place], [facility] Certified Genetic Counselor [email or web address] phone: [phone]  The patient was seen for a total of 60 minutes in face-to-face genetic counseling.  This patient was discussed with Drs. Magrinat, Lindi Adie and/or Burr Medico who agrees with the above.    _______________________________________________________________________ For Office Staff:  Number of people involved in session: 1 Was an Intern/ student involved with case: no

## 2017-07-25 ENCOUNTER — Telehealth: Payer: Self-pay | Admitting: Genetic Counselor

## 2017-07-25 ENCOUNTER — Ambulatory Visit: Payer: Self-pay | Admitting: Genetic Counselor

## 2017-07-25 ENCOUNTER — Encounter: Payer: Self-pay | Admitting: Genetic Counselor

## 2017-07-25 DIAGNOSIS — Z808 Family history of malignant neoplasm of other organs or systems: Secondary | ICD-10-CM

## 2017-07-25 DIAGNOSIS — Z1379 Encounter for other screening for genetic and chromosomal anomalies: Secondary | ICD-10-CM | POA: Insufficient documentation

## 2017-07-25 DIAGNOSIS — Z803 Family history of malignant neoplasm of breast: Secondary | ICD-10-CM

## 2017-07-25 DIAGNOSIS — Z836 Family history of other diseases of the respiratory system: Secondary | ICD-10-CM

## 2017-07-25 DIAGNOSIS — Z800 Family history of malignant neoplasm of digestive organs: Secondary | ICD-10-CM

## 2017-07-25 NOTE — Telephone Encounter (Signed)
LM on VM with good news.  Asked that she CB. 

## 2017-07-25 NOTE — Progress Notes (Signed)
HPI: [name] was previously seen in the [facility] due to a family history of cancer and pulmonary fibrosis and concerns regarding a hereditary predisposition to cancer. Please refer to our prior cancer genetics clinic note for more information regarding [name]'s medical, social and family histories, and our assessment and recommendations, at the time. [name] recent genetic test results were disclosed to her, as were recommendations warranted by these results. These results and recommendations are discussed in more detail below.  CANCER HISTORY:   No history exists.    FAMILY HISTORY:  We obtained a detailed, 4-generation family history.  Significant diagnoses are listed below: Family History  Problem Relation Age of Onset  . Colon cancer Mother 79       Negative Myriad MyRisk testing in 2014  . Pulmonary fibrosis Mother 76  . Colon polyps Mother        10-20 colon polyps  . Pancreatic cancer Father 43  . Other Brother 23       car accident  . Colon cancer Maternal Aunt 71       MSH6 VUS found on GeneDx panel in 2016  . Pulmonary fibrosis Maternal Uncle 74       had some testing in [place]  . Pulmonary fibrosis Maternal Grandfather        died in his 71s  . Breast cancer Paternal Grandmother        dx in her 71s  . Heart disease Paternal Grandfather   . Lung cancer Maternal Aunt   . COPD Maternal Uncle   . Schizophrenia Maternal Uncle   . Breast cancer Other        MGMs sister dx in her 15s  . Breast cancer Other        MGMs sister dx in her 42s  . Breast cancer Cousin        two of mother's maternal cousins dx in their 78s  . Melanoma Other        MGF's brother  . Pulmonary fibrosis Other        4 of MGF's brothers    The patient has two daughters who are cancer free.  She has one sister and one brother.  Her brother died at 79 from a car accident.  Her sister reportedly has less than 10 colon polyps.  The patient's  father died of pancreatic cancer at 69.  He has one sister who is living at 86.  Both of his parents are deceased.  His mother had breast cancer in her 46's and died at 43.  His father died from heart disease.  The patient's mother developed colon cancer at 67.  She had polyposis with 10-20 colon polyps during her lifetime.  She developed pulmonary fibrosis at age 36 and died at 28.  She has two sisters and two brothers.  One brother died of COPD and a sister died of lung cancer.  Another sister developed colon cancer and was found to have a VUS on her genetic testing.  A brother has been diagnosed with pulmonary fibrosis in [place].  He reportedly has undergone some type of testing, although the patient is not sure if it was genetic testing vs confirmation testing of pulmonary fibrosis.  The sister with colon cancer has also done this and is negative.  The maternal grandparents are deceased.  The grandmother died of old age, but she had two sisters with breast cancer, and one of them had two  daughters with breast cancer.  There were 6 other siblings, and one had a son with prostate cancer.  The grandfather died of pulmonary fibrosis.  He had a brother who died of melanoma, four brothers who died of pulmonary fibrosis and three sisters who died at later ages.  Patient's maternal ancestors are of [place], [place] and [place] descent, and paternal ancestors are of [place] descent. There is no reported Ashkenazi Jewish ancestry. There is no known consanguinity.  GENETIC TEST RESULTS: Genetic testing reported out on July 25, 2017 through the Multi- cancer panel found no deleterious mutations.  The Multi-Gene Panel offered by [facility] includes sequencing and/or deletion duplication testing of the following 80 genes: ALK, APC, ATM, AXIN2,BAP1,  BARD1, BLM, BMPR1A, BRCA1, BRCA2, BRIP1, CASR, CDC73, CDH1, CDK4, CDKN1B, CDKN1C, CDKN2A (p14ARF), CDKN2A (p16INK4a), CEBPA, CHEK2, CTNNA1, DICER1, DIS3L2, EGFR  (c.2369C>T, p.Thr790Met variant only), EPCAM (Deletion/duplication testing only), FH, FLCN, GATA2, GPC3, GREM1 (Promoter region deletion/duplication testing only), HOXB13 (c.251G>A, p.Gly84Glu), HRAS, KIT, MAX, MEN1, MET, MITF (c.952G>A, p.Glu318Lys variant only), MLH1, MSH2, MSH3, MSH6, MUTYH, NBN, NF1, NF2, NTHL1, PALB2, PDGFRA, PHOX2B, PMS2, POLD1, POLE, POT1, PRKAR1A, PTCH1, PTEN, RAD50, RAD51C, RAD51D, RB1, RECQL4, RET, RUNX1, SDHAF2, SDHA (sequence changes only), SDHB, SDHC, SDHD, SMAD4, SMARCA4, SMARCB1, SMARCE1, STK11, SUFU, TERT, TERT, TMEM127, TP53, TSC1, TSC2, VHL, WRN and WT1.  The test report has been scanned into EPIC and is located under the Molecular Pathology section of the Results Review tab.   We discussed with [name] that since the current genetic testing is not perfect, it is possible there may be a gene mutation in one of these genes that current testing cannot detect, but that chance is small. We also discussed, that it is possible that another gene that has not yet been discovered, or that we have not yet tested, is responsible for the cancer diagnoses in the family, and it is, therefore, important to remain in touch with cancer genetics in the future so that we can continue to offer [name] the most up to date genetic testing.   Genetic testing did detect a Variant of Unknown Significance in the POLE gene called c.217C>T. At this time, it is unknown if this variant is associated with increased cancer risk or if this is a normal finding, but most variants such as this get reclassified to being inconsequential. It should not be used to make medical management decisions. With time, we suspect the lab will determine the significance of this variant, if any. If we do learn more about it, we will try to contact [name] to discuss it further. However, it is important to stay in touch with [place] periodically and keep the address and phone number up to date.     CANCER  SCREENING RECOMMENDATIONS:  This normal result is reassuring and indicates that [name] does not likely have an increased risk of cancer due to a mutation in one of these genes.  We, therefore, recommended  [name] continue to follow the cancer screening guidelines provided by her primary healthcare providers.   Two genes, TERC and TERT, have been associated with Familial Pulmonary Fibrosis.  There were no genetic changes identified in either of those genes.  Given [name]'s  family history, we must interpret these negative results with some caution.  Families with features suggestive of hereditary risk for pulmonary fibrosis tend to have multiple family members and diagnoses in multiple generations. [name] family exhibits these features. Thus this result may simply reflect our  current inability to detect all mutations within these genes or there may be a different gene that has not yet been discovered or tested.   [name] will contact the clinic in [place] where her maternal aunt and uncle have been evaluated.  She will let me know if there is anything further I can assist her with.  RECOMMENDATIONS FOR FAMILY MEMBERS: Individuals in this family might be at some increased risk of developing cancer, over the general population risk, simply due to the family history of cancer. We recommended women in this family have a yearly mammogram beginning at age 64, or 47 years younger than the earliest onset of cancer, an annual clinical breast exam, and perform monthly breast self-exams. Women in this family should also have a gynecological exam as recommended by their primary provider. All family members should have a colonoscopy by age 48.  FOLLOW-UP: Lastly, we discussed with [name] that cancer genetics is a rapidly advancing field and it is possible that new genetic tests will be appropriate for her and/or her family members in the future. We encouraged her to remain in  contact with cancer genetics on an annual basis so we can update her personal and family histories and let her know of advances in cancer genetics that may benefit this family.   Our contact number was provided. [name] questions were answered to her satisfaction, and she knows she is welcome to call us at anytime with additional questions or concerns.   Roma Kayser, MS, [facility] Certified Genetic Counselor [email or web address]

## 2017-07-25 NOTE — Telephone Encounter (Signed)
Revealed negative genetic testing.  Discussed that we do not know why there is cancer in the family. It could be due to a different gene that we are not testing, or maybe our current technology may not be able to pick something up.  It will be important for her to keep in contact with genetics to keep up with whether additional testing may be needed. Two of the common IPF genes were also negative.  Discussed that there is a POLE VUS.  We will not change medical management based on this VUS.

## 2017-10-30 DIAGNOSIS — R439 Unspecified disturbances of smell and taste: Secondary | ICD-10-CM | POA: Diagnosis not present

## 2017-10-30 DIAGNOSIS — J329 Chronic sinusitis, unspecified: Secondary | ICD-10-CM | POA: Diagnosis not present

## 2017-11-03 ENCOUNTER — Other Ambulatory Visit: Payer: Self-pay | Admitting: Otolaryngology

## 2017-11-03 DIAGNOSIS — Z0000 Encounter for general adult medical examination without abnormal findings: Secondary | ICD-10-CM | POA: Diagnosis not present

## 2017-11-03 DIAGNOSIS — R430 Anosmia: Secondary | ICD-10-CM

## 2017-11-10 DIAGNOSIS — K589 Irritable bowel syndrome without diarrhea: Secondary | ICD-10-CM | POA: Diagnosis not present

## 2017-11-10 DIAGNOSIS — R1012 Left upper quadrant pain: Secondary | ICD-10-CM | POA: Diagnosis not present

## 2017-11-10 DIAGNOSIS — R438 Other disturbances of smell and taste: Secondary | ICD-10-CM | POA: Diagnosis not present

## 2017-11-10 DIAGNOSIS — Z1389 Encounter for screening for other disorder: Secondary | ICD-10-CM | POA: Diagnosis not present

## 2017-11-10 DIAGNOSIS — E668 Other obesity: Secondary | ICD-10-CM | POA: Diagnosis not present

## 2017-11-10 DIAGNOSIS — Z800 Family history of malignant neoplasm of digestive organs: Secondary | ICD-10-CM | POA: Diagnosis not present

## 2017-11-10 DIAGNOSIS — Z0000 Encounter for general adult medical examination without abnormal findings: Secondary | ICD-10-CM | POA: Diagnosis not present

## 2017-11-10 DIAGNOSIS — Z23 Encounter for immunization: Secondary | ICD-10-CM | POA: Diagnosis not present

## 2017-11-11 ENCOUNTER — Ambulatory Visit
Admission: RE | Admit: 2017-11-11 | Discharge: 2017-11-11 | Disposition: A | Discharge status: Home / Self Care | Payer: BLUE CROSS/BLUE SHIELD | Source: Ambulatory Visit | Attending: Otolaryngology | Admitting: Otolaryngology

## 2017-11-11 DIAGNOSIS — R430 Anosmia: Secondary | ICD-10-CM

## 2017-11-11 MED ORDER — GADOBENATE DIMEGLUMINE 529 MG/ML IV SOLN
15.0000 mL | Freq: Once | INTRAVENOUS | Status: AC | PRN
  Administered 2017-11-11: 15 mL via INTRAVENOUS

## 2018-01-03 DIAGNOSIS — Z713 Dietary counseling and surveillance: Secondary | ICD-10-CM | POA: Diagnosis not present

## 2018-01-16 DIAGNOSIS — N951 Menopausal and female climacteric states: Secondary | ICD-10-CM | POA: Diagnosis not present

## 2018-02-06 DIAGNOSIS — N951 Menopausal and female climacteric states: Secondary | ICD-10-CM | POA: Diagnosis not present

## 2018-02-06 DIAGNOSIS — Z13220 Encounter for screening for lipoid disorders: Secondary | ICD-10-CM | POA: Diagnosis not present

## 2018-02-06 DIAGNOSIS — E559 Vitamin D deficiency, unspecified: Secondary | ICD-10-CM | POA: Diagnosis not present

## 2018-02-06 DIAGNOSIS — Z13228 Encounter for screening for other metabolic disorders: Secondary | ICD-10-CM | POA: Diagnosis not present

## 2018-02-09 DIAGNOSIS — N950 Postmenopausal bleeding: Secondary | ICD-10-CM | POA: Diagnosis not present

## 2018-02-09 DIAGNOSIS — G4700 Insomnia, unspecified: Secondary | ICD-10-CM | POA: Diagnosis not present

## 2018-03-12 DIAGNOSIS — E559 Vitamin D deficiency, unspecified: Secondary | ICD-10-CM | POA: Diagnosis not present

## 2018-03-12 DIAGNOSIS — N951 Menopausal and female climacteric states: Secondary | ICD-10-CM | POA: Diagnosis not present

## 2018-03-12 DIAGNOSIS — R5383 Other fatigue: Secondary | ICD-10-CM | POA: Diagnosis not present

## 2018-03-12 DIAGNOSIS — L659 Nonscarring hair loss, unspecified: Secondary | ICD-10-CM | POA: Diagnosis not present

## 2018-04-11 DIAGNOSIS — E559 Vitamin D deficiency, unspecified: Secondary | ICD-10-CM | POA: Diagnosis not present

## 2018-04-11 DIAGNOSIS — R5383 Other fatigue: Secondary | ICD-10-CM | POA: Diagnosis not present

## 2018-04-11 DIAGNOSIS — N951 Menopausal and female climacteric states: Secondary | ICD-10-CM | POA: Diagnosis not present

## 2018-05-21 DIAGNOSIS — M545 Low back pain: Secondary | ICD-10-CM | POA: Diagnosis not present

## 2018-05-24 ENCOUNTER — Other Ambulatory Visit: Payer: Self-pay | Admitting: Gastroenterology

## 2018-05-24 DIAGNOSIS — R1013 Epigastric pain: Secondary | ICD-10-CM | POA: Diagnosis not present

## 2018-05-24 DIAGNOSIS — R109 Unspecified abdominal pain: Secondary | ICD-10-CM

## 2018-05-28 DIAGNOSIS — Z01419 Encounter for gynecological examination (general) (routine) without abnormal findings: Secondary | ICD-10-CM | POA: Diagnosis not present

## 2018-05-28 DIAGNOSIS — Z6830 Body mass index (BMI) 30.0-30.9, adult: Secondary | ICD-10-CM | POA: Diagnosis not present

## 2018-05-28 DIAGNOSIS — Z1231 Encounter for screening mammogram for malignant neoplasm of breast: Secondary | ICD-10-CM | POA: Diagnosis not present

## 2018-05-31 ENCOUNTER — Ambulatory Visit
Admission: RE | Admit: 2018-05-31 | Discharge: 2018-05-31 | Disposition: A | Discharge status: Home / Self Care | Payer: BLUE CROSS/BLUE SHIELD | Source: Ambulatory Visit | Attending: Gastroenterology | Admitting: Gastroenterology

## 2018-05-31 DIAGNOSIS — K5900 Constipation, unspecified: Secondary | ICD-10-CM | POA: Diagnosis not present

## 2018-05-31 DIAGNOSIS — R109 Unspecified abdominal pain: Secondary | ICD-10-CM

## 2018-06-05 ENCOUNTER — Other Ambulatory Visit: Payer: Self-pay | Admitting: Gastroenterology

## 2018-06-05 DIAGNOSIS — R140 Abdominal distension (gaseous): Secondary | ICD-10-CM

## 2018-06-05 DIAGNOSIS — R1013 Epigastric pain: Secondary | ICD-10-CM

## 2018-06-21 ENCOUNTER — Ambulatory Visit
Admission: RE | Admit: 2018-06-21 | Discharge: 2018-06-21 | Disposition: A | Discharge status: Home / Self Care | Payer: BLUE CROSS/BLUE SHIELD | Source: Ambulatory Visit | Attending: Gastroenterology | Admitting: Gastroenterology

## 2018-06-21 DIAGNOSIS — R1013 Epigastric pain: Secondary | ICD-10-CM

## 2018-06-21 DIAGNOSIS — D259 Leiomyoma of uterus, unspecified: Secondary | ICD-10-CM | POA: Diagnosis not present

## 2018-06-21 DIAGNOSIS — R140 Abdominal distension (gaseous): Secondary | ICD-10-CM

## 2018-06-21 MED ORDER — IOPAMIDOL (ISOVUE-300) INJECTION 61%
100.0000 mL | Freq: Once | INTRAVENOUS | Status: AC | PRN
  Administered 2018-06-21: 100 mL via INTRAVENOUS

## 2018-06-27 DIAGNOSIS — N951 Menopausal and female climacteric states: Secondary | ICD-10-CM | POA: Diagnosis not present

## 2018-06-27 DIAGNOSIS — E559 Vitamin D deficiency, unspecified: Secondary | ICD-10-CM | POA: Diagnosis not present

## 2018-06-27 DIAGNOSIS — R7982 Elevated C-reactive protein (CRP): Secondary | ICD-10-CM | POA: Diagnosis not present

## 2018-06-27 DIAGNOSIS — R5383 Other fatigue: Secondary | ICD-10-CM | POA: Diagnosis not present

## 2018-07-26 DIAGNOSIS — R5383 Other fatigue: Secondary | ICD-10-CM | POA: Diagnosis not present

## 2018-07-26 DIAGNOSIS — L659 Nonscarring hair loss, unspecified: Secondary | ICD-10-CM | POA: Diagnosis not present

## 2018-07-26 DIAGNOSIS — R7982 Elevated C-reactive protein (CRP): Secondary | ICD-10-CM | POA: Diagnosis not present

## 2018-07-26 DIAGNOSIS — N951 Menopausal and female climacteric states: Secondary | ICD-10-CM | POA: Diagnosis not present

## 2018-08-03 DIAGNOSIS — K589 Irritable bowel syndrome without diarrhea: Secondary | ICD-10-CM | POA: Diagnosis not present

## 2018-08-03 DIAGNOSIS — L308 Other specified dermatitis: Secondary | ICD-10-CM | POA: Diagnosis not present

## 2018-08-03 DIAGNOSIS — Z23 Encounter for immunization: Secondary | ICD-10-CM | POA: Diagnosis not present

## 2018-08-03 DIAGNOSIS — M2550 Pain in unspecified joint: Secondary | ICD-10-CM | POA: Diagnosis not present

## 2018-08-03 DIAGNOSIS — L658 Other specified nonscarring hair loss: Secondary | ICD-10-CM | POA: Diagnosis not present

## 2018-08-13 DIAGNOSIS — L986 Other infiltrative disorders of the skin and subcutaneous tissue: Secondary | ICD-10-CM | POA: Diagnosis not present

## 2018-10-03 ENCOUNTER — Other Ambulatory Visit: Payer: Self-pay | Admitting: Sports Medicine

## 2018-10-03 DIAGNOSIS — M5136 Other intervertebral disc degeneration, lumbar region: Secondary | ICD-10-CM

## 2018-10-03 DIAGNOSIS — M479 Spondylosis, unspecified: Secondary | ICD-10-CM

## 2018-10-03 DIAGNOSIS — M47816 Spondylosis without myelopathy or radiculopathy, lumbar region: Secondary | ICD-10-CM | POA: Diagnosis not present

## 2018-10-03 DIAGNOSIS — M545 Low back pain: Secondary | ICD-10-CM | POA: Diagnosis not present

## 2018-10-04 DIAGNOSIS — D225 Melanocytic nevi of trunk: Secondary | ICD-10-CM | POA: Diagnosis not present

## 2018-10-04 DIAGNOSIS — L821 Other seborrheic keratosis: Secondary | ICD-10-CM | POA: Diagnosis not present

## 2018-10-04 DIAGNOSIS — L812 Freckles: Secondary | ICD-10-CM | POA: Diagnosis not present

## 2018-10-15 ENCOUNTER — Other Ambulatory Visit: Payer: BLUE CROSS/BLUE SHIELD

## 2018-10-17 DIAGNOSIS — M47816 Spondylosis without myelopathy or radiculopathy, lumbar region: Secondary | ICD-10-CM | POA: Diagnosis not present

## 2018-10-17 DIAGNOSIS — M545 Low back pain: Secondary | ICD-10-CM | POA: Diagnosis not present

## 2018-10-18 DIAGNOSIS — M545 Low back pain: Secondary | ICD-10-CM | POA: Diagnosis not present

## 2018-10-30 DIAGNOSIS — L986 Other infiltrative disorders of the skin and subcutaneous tissue: Secondary | ICD-10-CM | POA: Diagnosis not present

## 2018-10-30 DIAGNOSIS — L309 Dermatitis, unspecified: Secondary | ICD-10-CM | POA: Diagnosis not present

## 2018-10-30 DIAGNOSIS — L661 Lichen planopilaris: Secondary | ICD-10-CM | POA: Diagnosis not present

## 2018-10-30 DIAGNOSIS — Z79899 Other long term (current) drug therapy: Secondary | ICD-10-CM | POA: Diagnosis not present

## 2018-10-30 DIAGNOSIS — D489 Neoplasm of uncertain behavior, unspecified: Secondary | ICD-10-CM | POA: Diagnosis not present

## 2018-10-30 DIAGNOSIS — R7989 Other specified abnormal findings of blood chemistry: Secondary | ICD-10-CM | POA: Diagnosis not present

## 2018-10-30 DIAGNOSIS — L308 Other specified dermatitis: Secondary | ICD-10-CM | POA: Diagnosis not present

## 2018-10-31 DIAGNOSIS — M545 Low back pain: Secondary | ICD-10-CM | POA: Diagnosis not present

## 2018-10-31 DIAGNOSIS — M47816 Spondylosis without myelopathy or radiculopathy, lumbar region: Secondary | ICD-10-CM | POA: Diagnosis not present

## 2018-10-31 DIAGNOSIS — Z713 Dietary counseling and surveillance: Secondary | ICD-10-CM | POA: Diagnosis not present

## 2018-11-02 DIAGNOSIS — L986 Other infiltrative disorders of the skin and subcutaneous tissue: Secondary | ICD-10-CM | POA: Diagnosis not present

## 2018-11-12 DIAGNOSIS — M545 Low back pain: Secondary | ICD-10-CM | POA: Diagnosis not present

## 2018-11-16 DIAGNOSIS — Z0000 Encounter for general adult medical examination without abnormal findings: Secondary | ICD-10-CM | POA: Diagnosis not present

## 2018-11-16 DIAGNOSIS — M545 Low back pain: Secondary | ICD-10-CM | POA: Diagnosis not present

## 2018-11-19 DIAGNOSIS — Z780 Asymptomatic menopausal state: Secondary | ICD-10-CM | POA: Diagnosis not present

## 2018-11-19 DIAGNOSIS — Z0000 Encounter for general adult medical examination without abnormal findings: Secondary | ICD-10-CM | POA: Diagnosis not present

## 2018-11-19 DIAGNOSIS — Z1389 Encounter for screening for other disorder: Secondary | ICD-10-CM | POA: Diagnosis not present

## 2018-11-19 DIAGNOSIS — C8400 Mycosis fungoides, unspecified site: Secondary | ICD-10-CM | POA: Diagnosis not present

## 2018-11-19 DIAGNOSIS — K589 Irritable bowel syndrome without diarrhea: Secondary | ICD-10-CM | POA: Diagnosis not present

## 2018-11-19 DIAGNOSIS — E7849 Other hyperlipidemia: Secondary | ICD-10-CM | POA: Diagnosis not present

## 2018-11-22 DIAGNOSIS — M47816 Spondylosis without myelopathy or radiculopathy, lumbar region: Secondary | ICD-10-CM | POA: Diagnosis not present

## 2018-11-29 ENCOUNTER — Emergency Department (HOSPITAL_COMMUNITY): Payer: BLUE CROSS/BLUE SHIELD

## 2018-11-29 ENCOUNTER — Other Ambulatory Visit: Payer: Self-pay

## 2018-11-29 ENCOUNTER — Encounter (HOSPITAL_COMMUNITY): Payer: Self-pay

## 2018-11-29 ENCOUNTER — Emergency Department (HOSPITAL_COMMUNITY)
Admission: EM | Admit: 2018-11-29 | Discharge: 2018-11-30 | Disposition: A | Discharge status: Home / Self Care | Payer: BLUE CROSS/BLUE SHIELD | Attending: Emergency Medicine | Admitting: Emergency Medicine

## 2018-11-29 DIAGNOSIS — Z6831 Body mass index (BMI) 31.0-31.9, adult: Secondary | ICD-10-CM | POA: Diagnosis not present

## 2018-11-29 DIAGNOSIS — R0602 Shortness of breath: Secondary | ICD-10-CM | POA: Diagnosis not present

## 2018-11-29 DIAGNOSIS — M79661 Pain in right lower leg: Secondary | ICD-10-CM | POA: Diagnosis not present

## 2018-11-29 DIAGNOSIS — I2694 Multiple subsegmental pulmonary emboli without acute cor pulmonale: Secondary | ICD-10-CM | POA: Insufficient documentation

## 2018-11-29 DIAGNOSIS — R0609 Other forms of dyspnea: Secondary | ICD-10-CM | POA: Diagnosis not present

## 2018-11-29 LAB — CBC
HCT: 42.8 % (ref 36.0–46.0)
Hemoglobin: 13.9 g/dL (ref 12.0–15.0)
MCH: 29.6 pg (ref 26.0–34.0)
MCHC: 32.5 g/dL (ref 30.0–36.0)
MCV: 91.1 fL (ref 80.0–100.0)
Platelets: 198 K/uL (ref 150–400)
RBC: 4.7 MIL/uL (ref 3.87–5.11)
RDW: 12.3 % (ref 11.5–15.5)
WBC: 5.5 K/uL (ref 4.0–10.5)
nRBC: 0 % (ref 0.0–0.2)

## 2018-11-29 LAB — BASIC METABOLIC PANEL WITH GFR
Anion gap: 13 (ref 5–15)
BUN: 21 mg/dL — ABNORMAL HIGH (ref 6–20)
CO2: 25 mmol/L (ref 22–32)
Calcium: 9.4 mg/dL (ref 8.9–10.3)
Chloride: 103 mmol/L (ref 98–111)
Creatinine, Ser: 1.06 mg/dL — ABNORMAL HIGH (ref 0.44–1.00)
GFR calc Af Amer: >60 mL/min
GFR calc non Af Amer: >60 mL/min
Glucose, Bld: 92 mg/dL (ref 70–99)
Potassium: 4.3 mmol/L (ref 3.5–5.1)
Sodium: 141 mmol/L (ref 135–145)

## 2018-11-29 MED ORDER — IOPAMIDOL (ISOVUE-370) INJECTION 76%
INTRAVENOUS | Status: AC
  Filled 2018-11-29: qty 100

## 2018-11-29 MED ORDER — IOPAMIDOL (ISOVUE-370) INJECTION 76%
100.0000 mL | Freq: Once | INTRAVENOUS | Status: AC | PRN
  Administered 2018-11-29: 100 mL via INTRAVENOUS

## 2018-11-29 NOTE — ED Triage Notes (Signed)
Pt here with positive D dimer results for PCP.  Pt report shortness of breath with no chest pain.  A&Ox4.  No shortness of breath while at rest.

## 2018-11-30 NOTE — ED Notes (Signed)
Patient ambulated with steady gate. Pulse rate of 75 and O2 saturation of 96% Room air. Dr. Clayborne DanaMesner has been notified.
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
## 2018-12-12 DIAGNOSIS — L986 Other infiltrative disorders of the skin and subcutaneous tissue: Secondary | ICD-10-CM | POA: Diagnosis not present

## 2019-03-14 DIAGNOSIS — I2699 Other pulmonary embolism without acute cor pulmonale: Secondary | ICD-10-CM | POA: Diagnosis not present

## 2019-03-14 DIAGNOSIS — Z20828 Contact with and (suspected) exposure to other viral communicable diseases: Secondary | ICD-10-CM | POA: Diagnosis not present

## 2019-03-14 DIAGNOSIS — Z7901 Long term (current) use of anticoagulants: Secondary | ICD-10-CM | POA: Diagnosis not present

## 2019-03-14 DIAGNOSIS — Z832 Family history of diseases of the blood and blood-forming organs and certain disorders involving the immune mechanism: Secondary | ICD-10-CM | POA: Diagnosis not present

## 2019-03-15 DIAGNOSIS — Z20828 Contact with and (suspected) exposure to other viral communicable diseases: Secondary | ICD-10-CM | POA: Diagnosis not present

## 2019-08-07 DIAGNOSIS — Z832 Family history of diseases of the blood and blood-forming organs and certain disorders involving the immune mechanism: Secondary | ICD-10-CM | POA: Diagnosis not present

## 2019-08-07 DIAGNOSIS — Z7901 Long term (current) use of anticoagulants: Secondary | ICD-10-CM | POA: Diagnosis not present

## 2019-09-24 DIAGNOSIS — M542 Cervicalgia: Secondary | ICD-10-CM | POA: Diagnosis not present

## 2019-09-24 DIAGNOSIS — M545 Low back pain: Secondary | ICD-10-CM | POA: Diagnosis not present

## 2019-11-14 DIAGNOSIS — E7849 Other hyperlipidemia: Secondary | ICD-10-CM | POA: Diagnosis not present

## 2019-11-21 DIAGNOSIS — E7849 Other hyperlipidemia: Secondary | ICD-10-CM | POA: Diagnosis not present

## 2019-11-21 DIAGNOSIS — Z7901 Long term (current) use of anticoagulants: Secondary | ICD-10-CM | POA: Diagnosis not present

## 2019-11-21 DIAGNOSIS — E785 Hyperlipidemia, unspecified: Secondary | ICD-10-CM | POA: Diagnosis not present

## 2019-11-21 DIAGNOSIS — Z0000 Encounter for general adult medical examination without abnormal findings: Secondary | ICD-10-CM | POA: Diagnosis not present

## 2019-11-21 DIAGNOSIS — Z832 Family history of diseases of the blood and blood-forming organs and certain disorders involving the immune mechanism: Secondary | ICD-10-CM | POA: Diagnosis not present

## 2019-11-21 DIAGNOSIS — Z86711 Personal history of pulmonary embolism: Secondary | ICD-10-CM | POA: Diagnosis not present

## 2019-11-21 DIAGNOSIS — Z1331 Encounter for screening for depression: Secondary | ICD-10-CM | POA: Diagnosis not present

## 2020-01-01 IMAGING — CT CT ANGIO CHEST
2 of 7 series · 19 of 46 positions shown · IV contrast (APPLIED)
Comparison: None.

CLINICAL DATA: 52-year-old female with shortness of breath and
positive D-dimer.

EXAM:
CT ANGIOGRAPHY CHEST WITH CONTRAST
TECHNIQUE: Multidetector CT imaging of the chest was performed using the
standard protocol during bolus administration of intravenous
contrast. Multiplanar CT image reconstructions and MIPs were
obtained to evaluate the vascular anatomy.
CONTRAST:  100mL N11EV3-822 IOPAMIDOL ([id number]) INJECTION 76%

[Series 7: thins · axial · 0.64mm/px · z∈[+905,+1123]mm · 16 of 352 slices shown]
[im 20/352  lung]
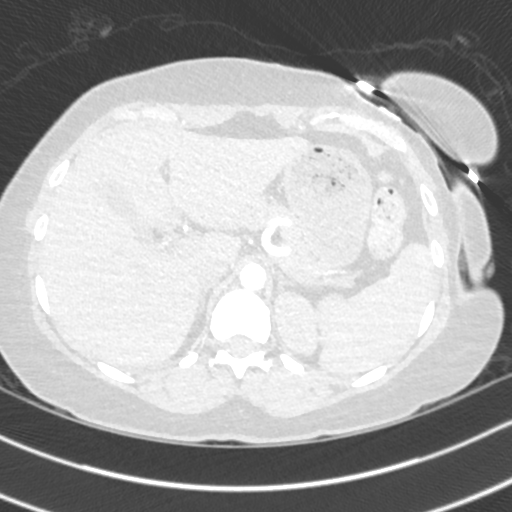
[im 40/352  soft-tissue]
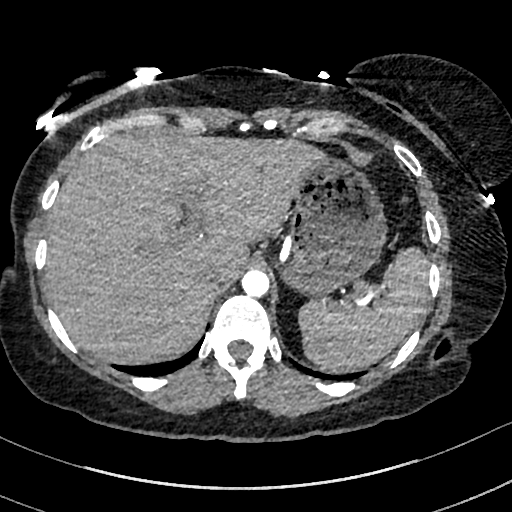
[im 59/352  lung]
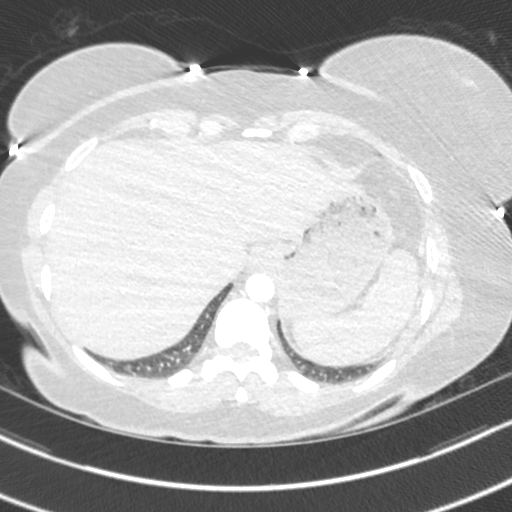
[im 79/352  soft-tissue]
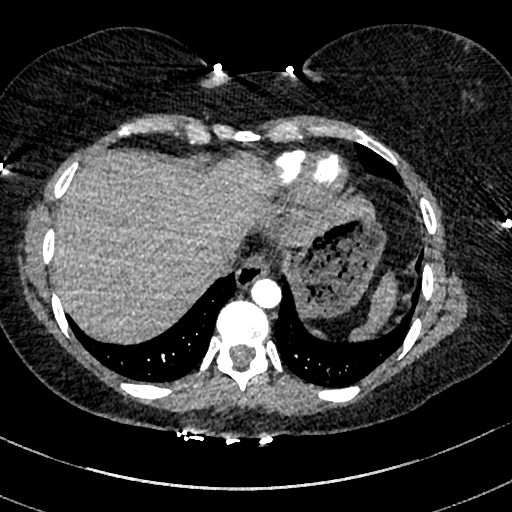
[im 98/352  lung]
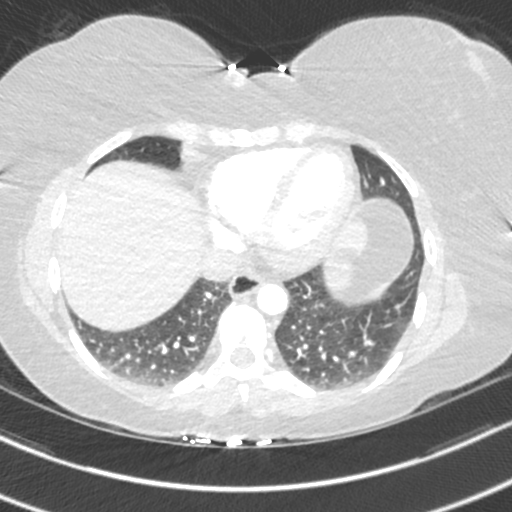
[im 118/352  soft-tissue]
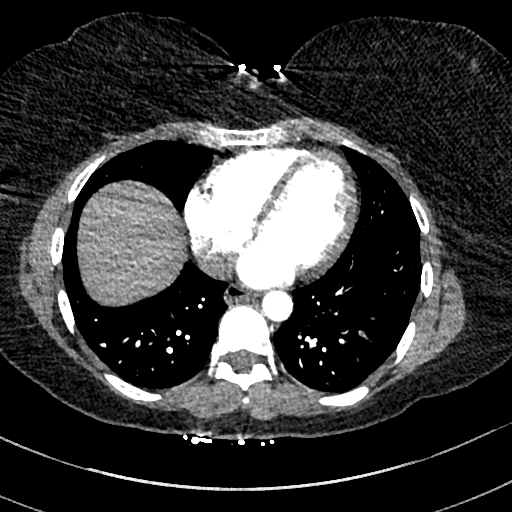
[im 137/352  lung]
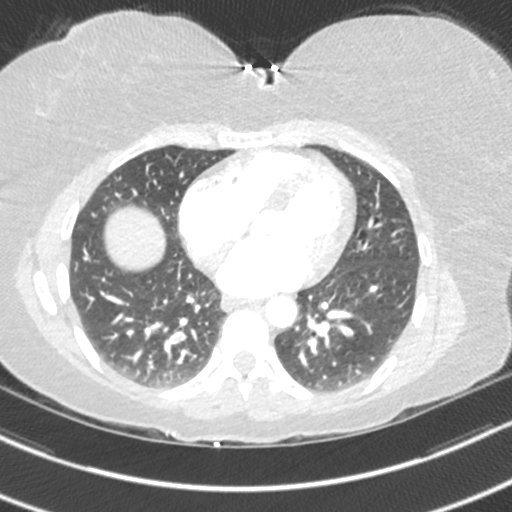
[im 157/352  soft-tissue]
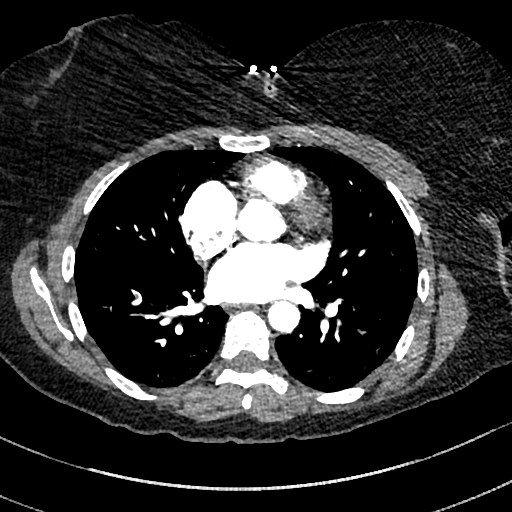
[im 196/352  lung]
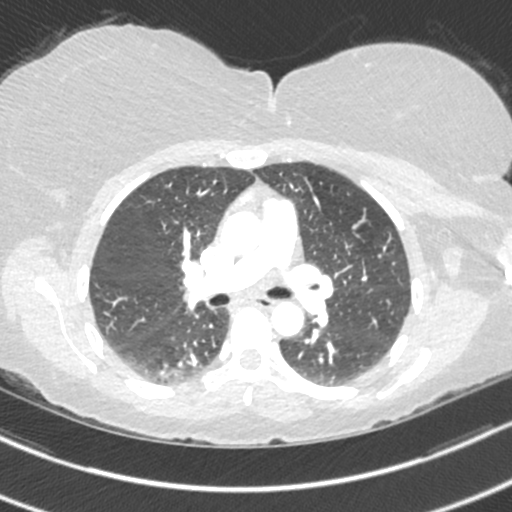
[im 215/352  soft-tissue]
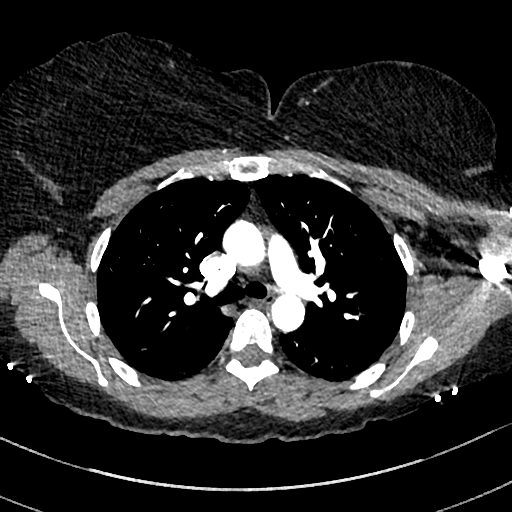
[im 235/352  lung]
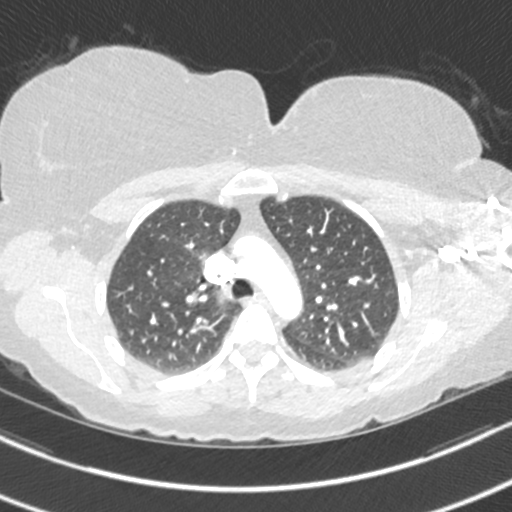
[im 254/352  soft-tissue]
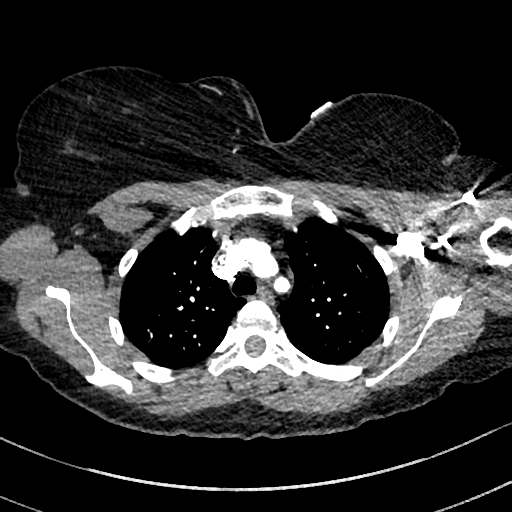
[im 274/352  lung]
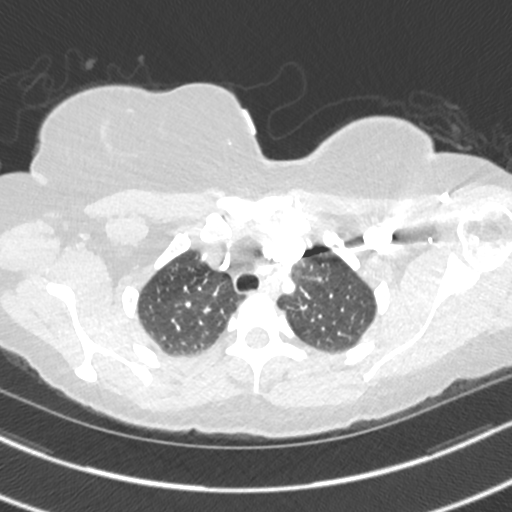
[im 293/352  soft-tissue]
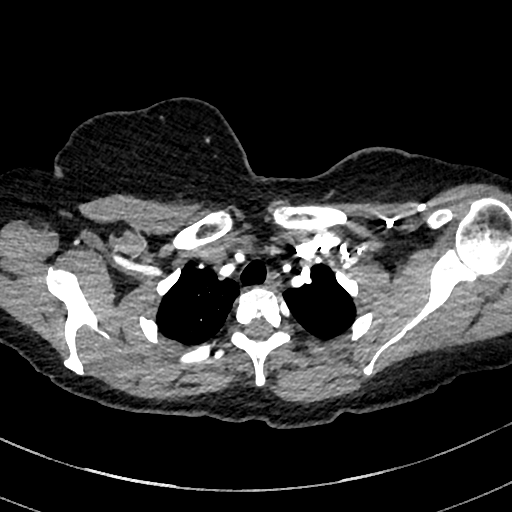
[im 313/352  lung]
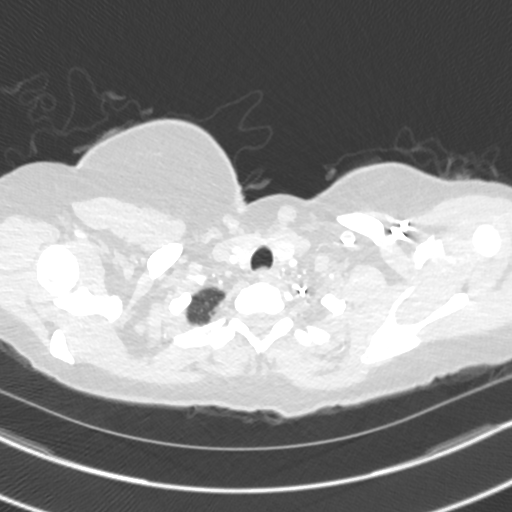
[im 332/352  soft-tissue]
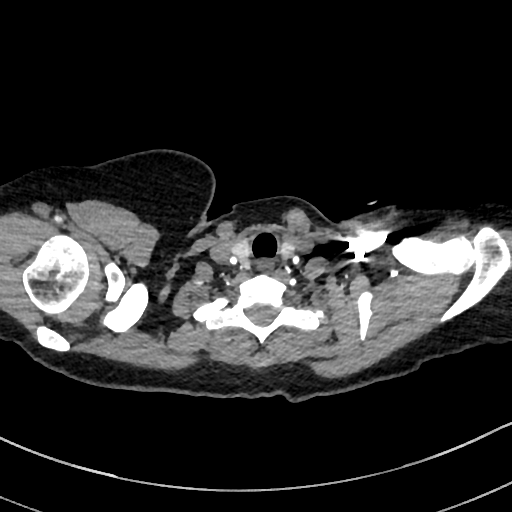

[Series 8: cor · coronal · 0.59mm/px · 3 of 151 slices shown]
[im 38/151  soft-tissue]
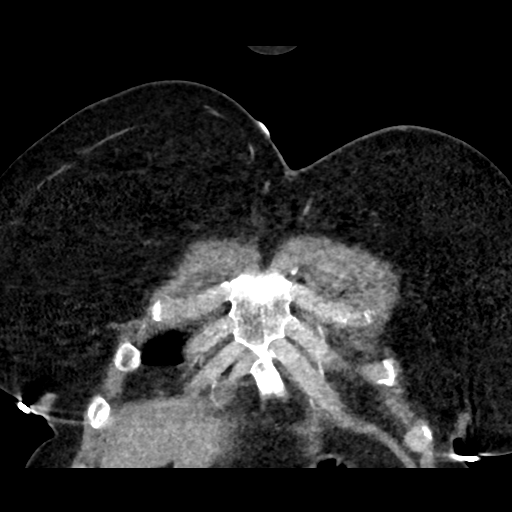
[im 76/151  soft-tissue]
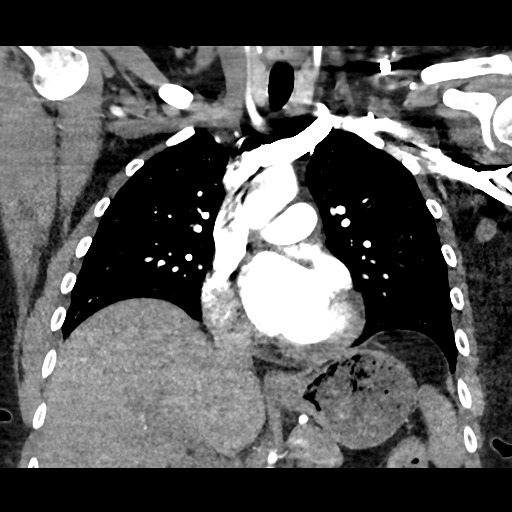
[im 113/151  soft-tissue]
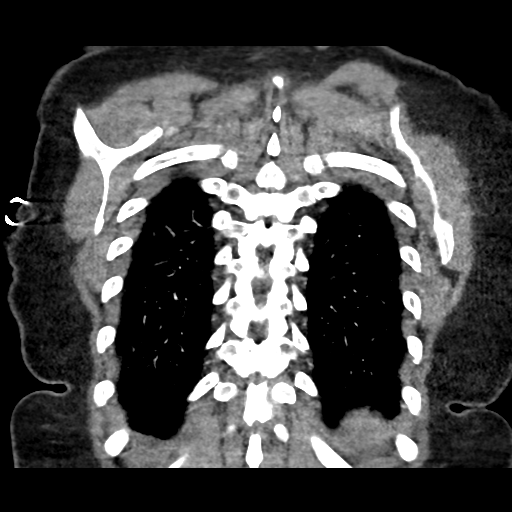

[19 of 46 positions shown; findings below may reference images not displayed]

FINDINGS: Cardiovascular: There is no cardiomegaly or pericardial effusion.
The thoracic aorta is unremarkable. The visualized origins of the
great vessels of the aortic arch are patent. Bilateral subsegmental
pulmonary artery emboli noted in the left lower lobe, right lower
lobe, and right middle lobe. No CT evidence of right heart
straining.

Mediastinum/Nodes: There is no hilar or mediastinal adenopathy.
Esophagus and the thyroid gland are grossly unremarkable. No
mediastinal fluid collection.

Lungs/Pleura: The lungs are clear. There is no pleural effusion or
pneumothorax. The central airways are patent.

Upper Abdomen: No acute abnormality.

Musculoskeletal: No chest wall abnormality. No acute or significant
osseous findings.

Review of the MIP images confirms the above findings.
IMPRESSION: Bilateral subsegmental pulmonary artery emboli. No CT evidence of
right heart straining.

These results were called by telephone at the time of interpretation
on 11/29/2018 at [DATE] to Dr. Savion, who verbally acknowledged
these results.

## 2020-01-14 DIAGNOSIS — Z0000 Encounter for general adult medical examination without abnormal findings: Secondary | ICD-10-CM | POA: Diagnosis not present

## 2020-01-28 DIAGNOSIS — Z01419 Encounter for gynecological examination (general) (routine) without abnormal findings: Secondary | ICD-10-CM | POA: Diagnosis not present

## 2020-01-28 DIAGNOSIS — Z6830 Body mass index (BMI) 30.0-30.9, adult: Secondary | ICD-10-CM | POA: Diagnosis not present

## 2020-01-28 DIAGNOSIS — Z1231 Encounter for screening mammogram for malignant neoplasm of breast: Secondary | ICD-10-CM | POA: Diagnosis not present

## 2020-02-26 IMAGING — US US ABDOMEN COMPLETE
1 series · 14 of 25 positions shown · non-contrast
Comparison: None

CLINICAL DATA: Abdominal pain all over, nausea, diarrhea and
constipation for years

EXAM:
ABDOMEN ULTRASOUND COMPLETE

[Series 1: us abdomen complete · 0.20mm/px · 14 of 81 slices shown]
[im 1/81]
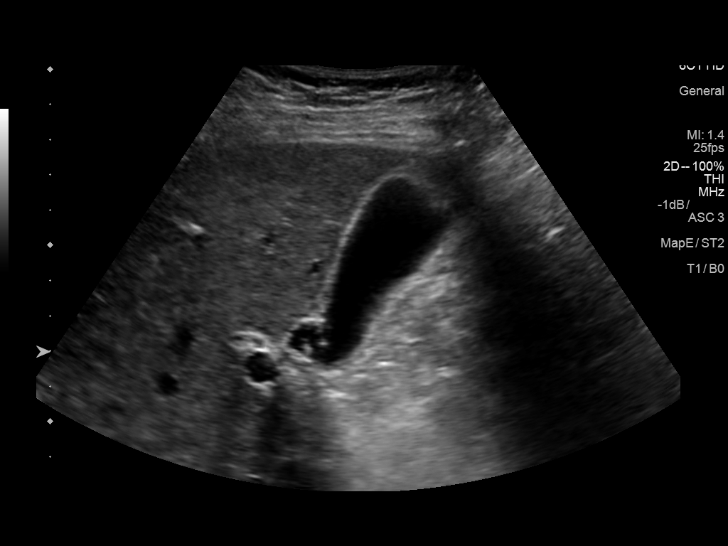
[im 7/81]
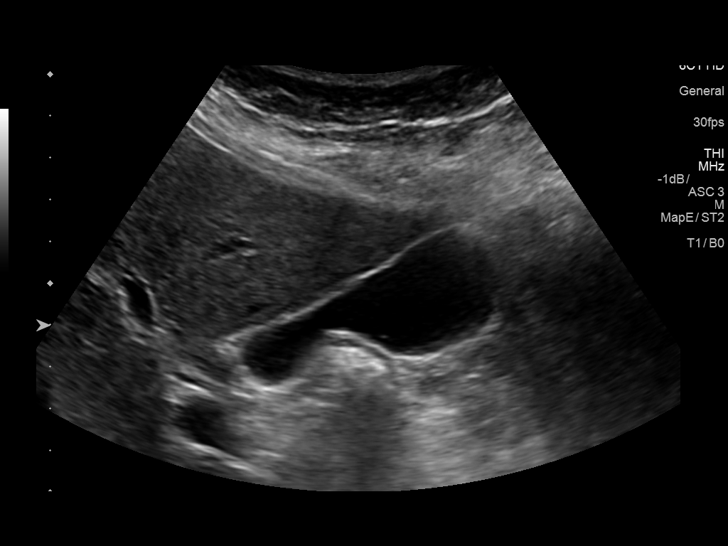
[im 14/81]
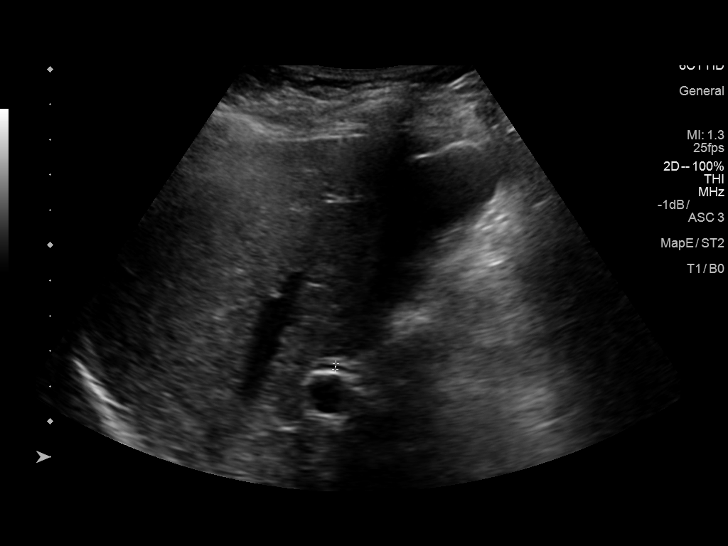
[im 21/81]
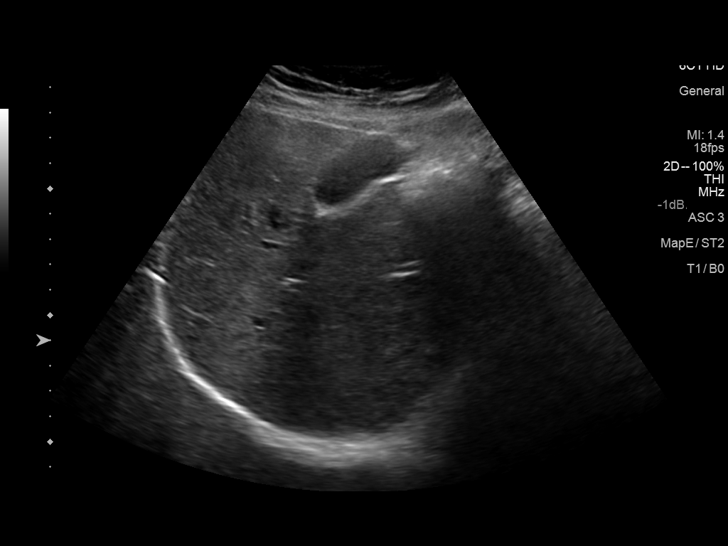
[im 27/81]
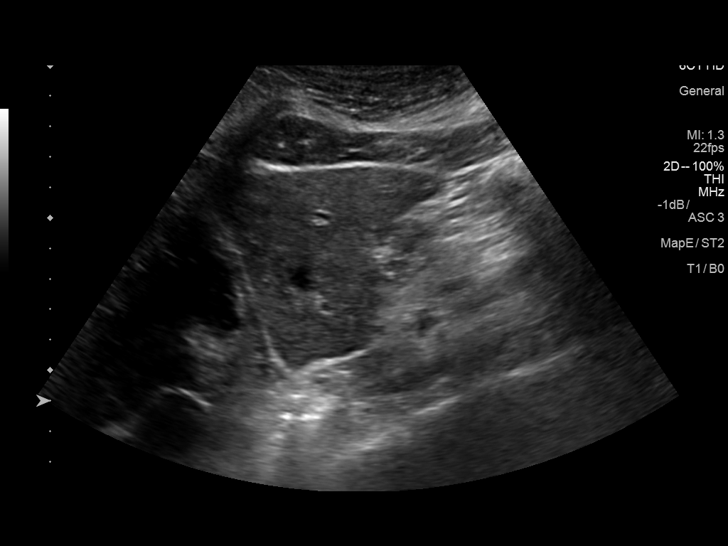
[im 31/81]
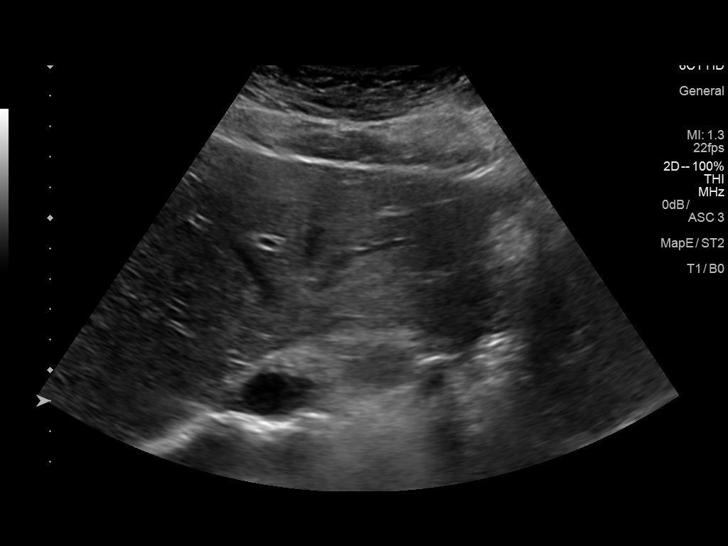
[im 37/81]
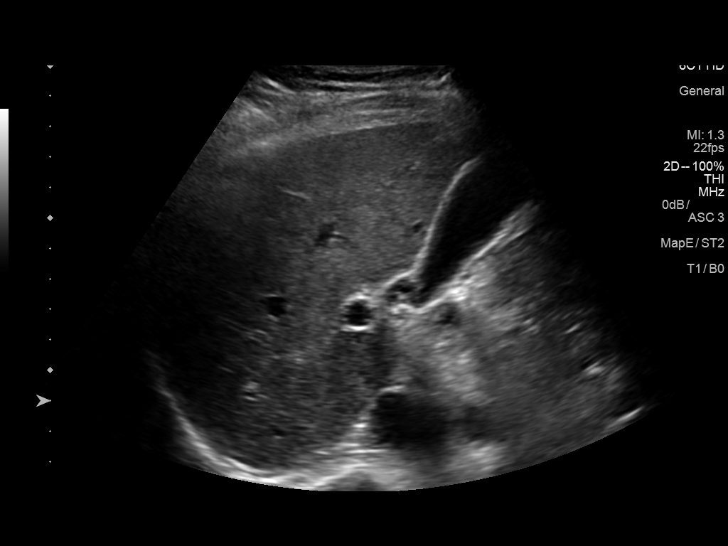
[im 44/81]
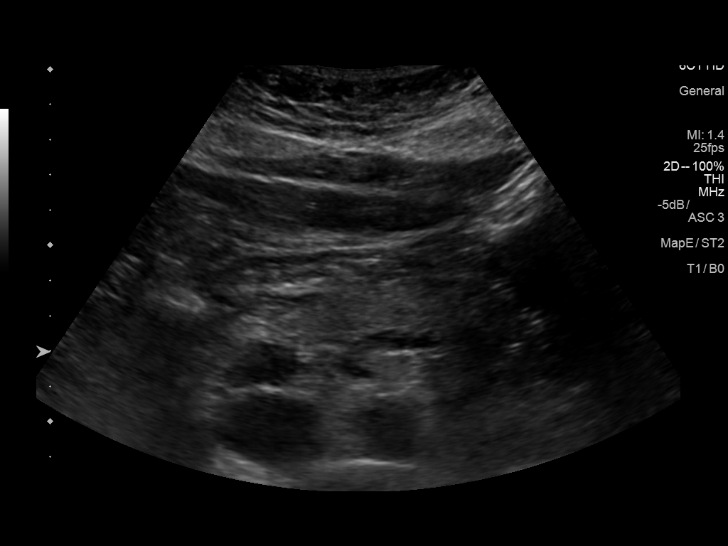
[im 51/81]
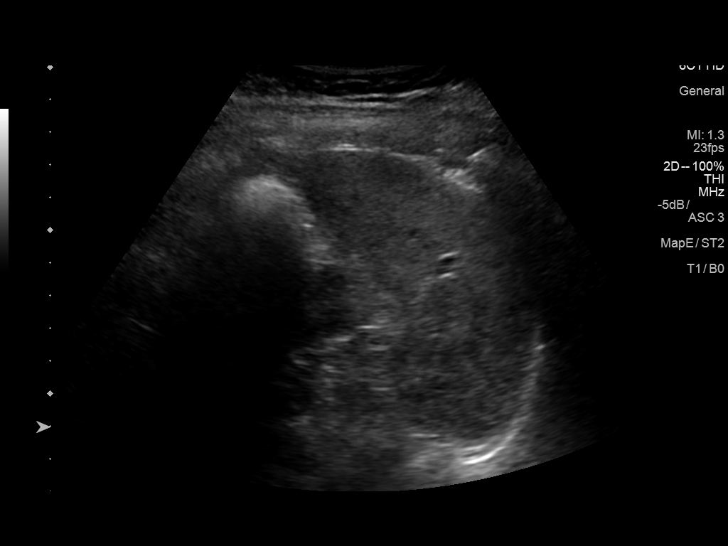
[im 54/81]
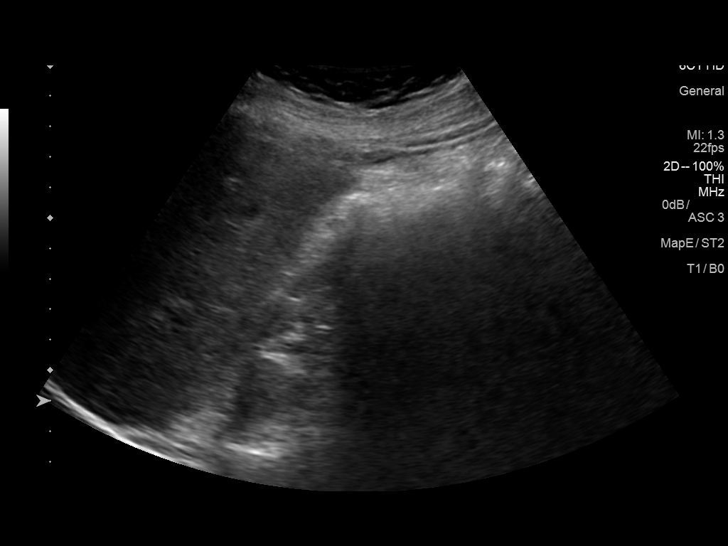
[im 61/81]
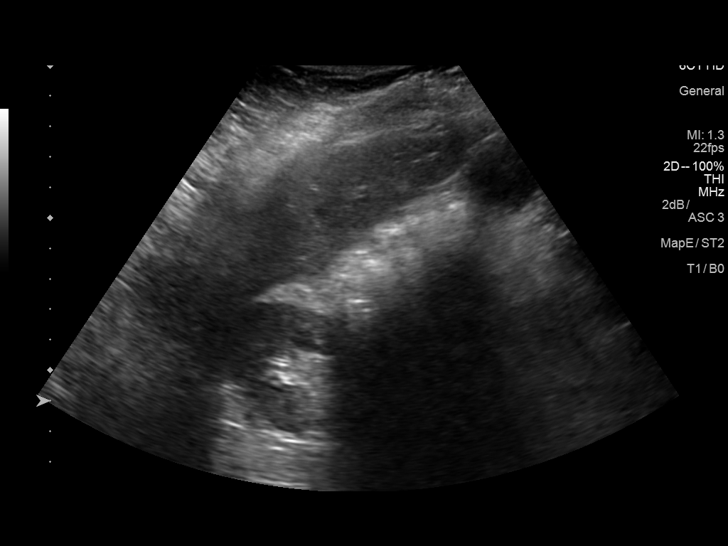
[im 67/81]
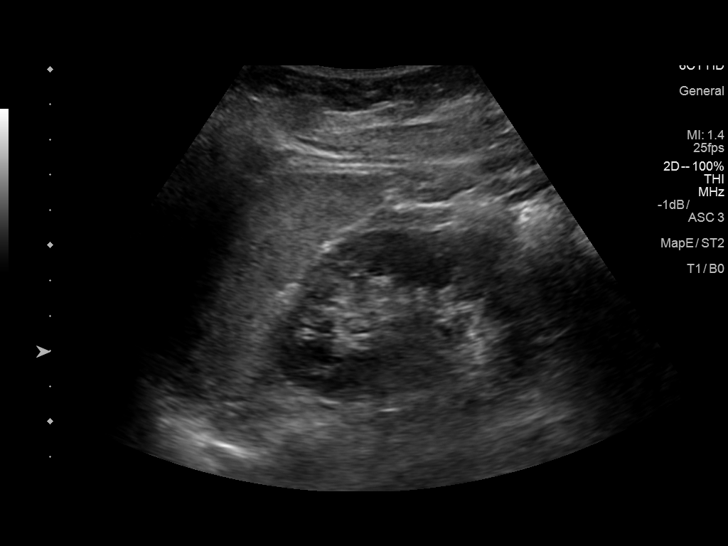
[im 74/81]
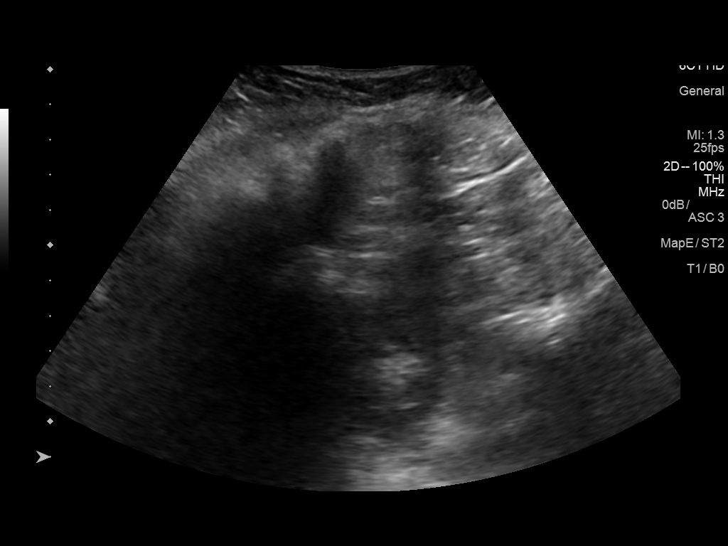
[im 81/81]
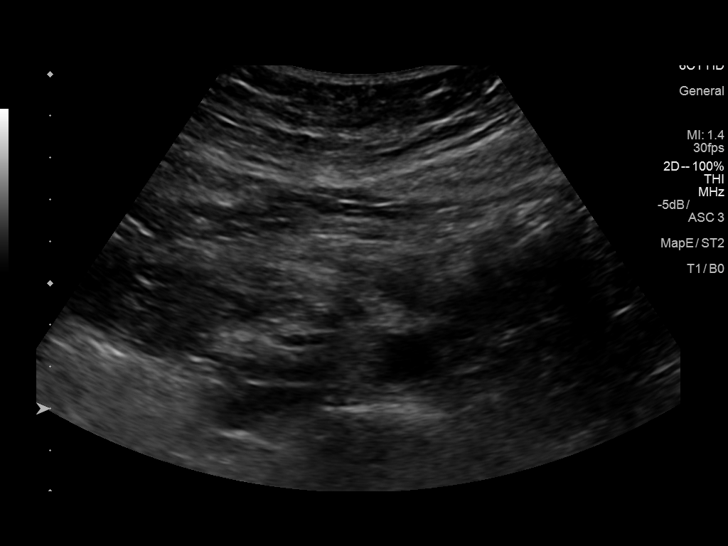

[14 of 25 positions shown; findings below may reference images not displayed]

FINDINGS: Gallbladder: Normally distended without stones or wall thickening.
No pericholecystic fluid or sonographic Murphy sign.

Common bile duct: Diameter: 4 mm diameter, normal

Liver: Normal appearance. No hepatic mass or nodularity portal vein
is patent on color Doppler imaging with normal direction of blood
flow towards the liver.

IVC: Normal appearance

Pancreas: Normal appearance

Spleen: Normal appearance, 6.8 cm length

Right Kidney: Length: 10.1 cm. Normal morphology without mass or
hydronephrosis.

Left Kidney: Length: 9.2 cm. Normal morphology without mass or
hydronephrosis.

Abdominal aorta: Normal caliber

Other findings: No free fluid
IMPRESSION: Normal exam.

## 2020-04-23 DIAGNOSIS — M545 Low back pain: Secondary | ICD-10-CM | POA: Diagnosis not present

## 2020-04-23 DIAGNOSIS — M542 Cervicalgia: Secondary | ICD-10-CM | POA: Diagnosis not present

## 2020-05-11 DIAGNOSIS — Z20828 Contact with and (suspected) exposure to other viral communicable diseases: Secondary | ICD-10-CM | POA: Diagnosis not present

## 2020-07-23 DIAGNOSIS — R05 Cough: Secondary | ICD-10-CM | POA: Diagnosis not present

## 2020-07-23 DIAGNOSIS — J069 Acute upper respiratory infection, unspecified: Secondary | ICD-10-CM | POA: Diagnosis not present

## 2020-08-15 DIAGNOSIS — Z23 Encounter for immunization: Secondary | ICD-10-CM | POA: Diagnosis not present

## 2020-11-16 DIAGNOSIS — M542 Cervicalgia: Secondary | ICD-10-CM | POA: Diagnosis not present

## 2020-11-16 DIAGNOSIS — M545 Low back pain, unspecified: Secondary | ICD-10-CM | POA: Diagnosis not present

## 2020-12-02 DIAGNOSIS — M542 Cervicalgia: Secondary | ICD-10-CM | POA: Diagnosis not present

## 2020-12-02 DIAGNOSIS — M545 Low back pain, unspecified: Secondary | ICD-10-CM | POA: Diagnosis not present

## 2020-12-07 DIAGNOSIS — E785 Hyperlipidemia, unspecified: Secondary | ICD-10-CM | POA: Diagnosis not present

## 2020-12-14 DIAGNOSIS — Z0000 Encounter for general adult medical examination without abnormal findings: Secondary | ICD-10-CM | POA: Diagnosis not present

## 2020-12-14 DIAGNOSIS — F419 Anxiety disorder, unspecified: Secondary | ICD-10-CM | POA: Diagnosis not present

## 2020-12-14 DIAGNOSIS — E785 Hyperlipidemia, unspecified: Secondary | ICD-10-CM | POA: Diagnosis not present

## 2020-12-22 DIAGNOSIS — M542 Cervicalgia: Secondary | ICD-10-CM | POA: Diagnosis not present

## 2020-12-22 DIAGNOSIS — M545 Low back pain, unspecified: Secondary | ICD-10-CM | POA: Diagnosis not present

## 2021-01-12 DIAGNOSIS — M545 Low back pain, unspecified: Secondary | ICD-10-CM | POA: Diagnosis not present

## 2021-01-12 DIAGNOSIS — M542 Cervicalgia: Secondary | ICD-10-CM | POA: Diagnosis not present

## 2021-03-22 DIAGNOSIS — Z01419 Encounter for gynecological examination (general) (routine) without abnormal findings: Secondary | ICD-10-CM | POA: Diagnosis not present

## 2021-03-22 DIAGNOSIS — Z6829 Body mass index (BMI) 29.0-29.9, adult: Secondary | ICD-10-CM | POA: Diagnosis not present

## 2021-03-22 DIAGNOSIS — Z1231 Encounter for screening mammogram for malignant neoplasm of breast: Secondary | ICD-10-CM | POA: Diagnosis not present

## 2021-04-27 DIAGNOSIS — M5030 Other cervical disc degeneration, unspecified cervical region: Secondary | ICD-10-CM | POA: Diagnosis not present

## 2021-04-27 DIAGNOSIS — M19071 Primary osteoarthritis, right ankle and foot: Secondary | ICD-10-CM | POA: Diagnosis not present

## 2021-06-08 DIAGNOSIS — K5900 Constipation, unspecified: Secondary | ICD-10-CM | POA: Diagnosis not present

## 2021-06-08 DIAGNOSIS — R195 Other fecal abnormalities: Secondary | ICD-10-CM | POA: Diagnosis not present

## 2021-06-08 DIAGNOSIS — R1013 Epigastric pain: Secondary | ICD-10-CM | POA: Diagnosis not present

## 2021-06-08 DIAGNOSIS — R1032 Left lower quadrant pain: Secondary | ICD-10-CM | POA: Diagnosis not present

## 2021-06-10 ENCOUNTER — Other Ambulatory Visit: Payer: Self-pay | Admitting: Gastroenterology

## 2021-06-10 ENCOUNTER — Ambulatory Visit
Admission: RE | Admit: 2021-06-10 | Discharge: 2021-06-10 | Disposition: A | Discharge status: Home / Self Care | Payer: BLUE CROSS/BLUE SHIELD | Source: Ambulatory Visit | Attending: Gastroenterology | Admitting: Gastroenterology

## 2021-06-10 DIAGNOSIS — R109 Unspecified abdominal pain: Secondary | ICD-10-CM | POA: Diagnosis not present

## 2021-06-10 DIAGNOSIS — K5900 Constipation, unspecified: Secondary | ICD-10-CM

## 2021-06-10 DIAGNOSIS — R1012 Left upper quadrant pain: Secondary | ICD-10-CM | POA: Diagnosis not present

## 2021-06-17 ENCOUNTER — Other Ambulatory Visit: Payer: Self-pay | Admitting: Sports Medicine

## 2021-06-17 DIAGNOSIS — M542 Cervicalgia: Secondary | ICD-10-CM

## 2021-06-17 DIAGNOSIS — M47816 Spondylosis without myelopathy or radiculopathy, lumbar region: Secondary | ICD-10-CM | POA: Diagnosis not present

## 2021-06-17 DIAGNOSIS — M5030 Other cervical disc degeneration, unspecified cervical region: Secondary | ICD-10-CM | POA: Diagnosis not present

## 2021-06-17 DIAGNOSIS — M19071 Primary osteoarthritis, right ankle and foot: Secondary | ICD-10-CM | POA: Diagnosis not present

## 2021-06-27 ENCOUNTER — Ambulatory Visit
Admission: RE | Admit: 2021-06-27 | Discharge: 2021-06-27 | Disposition: A | Discharge status: Home / Self Care | Payer: BC Managed Care – PPO | Source: Ambulatory Visit | Attending: Sports Medicine | Admitting: Sports Medicine

## 2021-06-27 DIAGNOSIS — M542 Cervicalgia: Secondary | ICD-10-CM

## 2021-07-14 DIAGNOSIS — M47812 Spondylosis without myelopathy or radiculopathy, cervical region: Secondary | ICD-10-CM | POA: Diagnosis not present

## 2021-07-28 DIAGNOSIS — R1032 Left lower quadrant pain: Secondary | ICD-10-CM | POA: Diagnosis not present

## 2021-07-28 DIAGNOSIS — K5900 Constipation, unspecified: Secondary | ICD-10-CM | POA: Diagnosis not present

## 2021-07-28 DIAGNOSIS — R1013 Epigastric pain: Secondary | ICD-10-CM | POA: Diagnosis not present

## 2021-08-02 DIAGNOSIS — M47812 Spondylosis without myelopathy or radiculopathy, cervical region: Secondary | ICD-10-CM | POA: Diagnosis not present

## 2021-08-25 DIAGNOSIS — M47812 Spondylosis without myelopathy or radiculopathy, cervical region: Secondary | ICD-10-CM | POA: Diagnosis not present

## 2021-08-25 DIAGNOSIS — Z6829 Body mass index (BMI) 29.0-29.9, adult: Secondary | ICD-10-CM | POA: Diagnosis not present

## 2021-09-04 DIAGNOSIS — Z23 Encounter for immunization: Secondary | ICD-10-CM | POA: Diagnosis not present

## 2021-11-04 DIAGNOSIS — M79671 Pain in right foot: Secondary | ICD-10-CM | POA: Diagnosis not present

## 2021-11-04 DIAGNOSIS — M2021 Hallux rigidus, right foot: Secondary | ICD-10-CM | POA: Diagnosis not present

## 2021-12-02 DIAGNOSIS — L821 Other seborrheic keratosis: Secondary | ICD-10-CM | POA: Diagnosis not present

## 2021-12-09 DIAGNOSIS — Z800 Family history of malignant neoplasm of digestive organs: Secondary | ICD-10-CM | POA: Diagnosis not present

## 2021-12-09 DIAGNOSIS — K5730 Diverticulosis of large intestine without perforation or abscess without bleeding: Secondary | ICD-10-CM | POA: Diagnosis not present

## 2021-12-09 DIAGNOSIS — Z86010 Personal history of colonic polyps: Secondary | ICD-10-CM | POA: Diagnosis not present

## 2021-12-09 DIAGNOSIS — R1013 Epigastric pain: Secondary | ICD-10-CM | POA: Diagnosis not present

## 2021-12-09 DIAGNOSIS — K635 Polyp of colon: Secondary | ICD-10-CM | POA: Diagnosis not present

## 2021-12-09 DIAGNOSIS — K449 Diaphragmatic hernia without obstruction or gangrene: Secondary | ICD-10-CM | POA: Diagnosis not present

## 2021-12-09 DIAGNOSIS — K649 Unspecified hemorrhoids: Secondary | ICD-10-CM | POA: Diagnosis not present

## 2021-12-09 DIAGNOSIS — R58 Hemorrhage, not elsewhere classified: Secondary | ICD-10-CM | POA: Diagnosis not present

## 2021-12-13 DIAGNOSIS — M47812 Spondylosis without myelopathy or radiculopathy, cervical region: Secondary | ICD-10-CM | POA: Diagnosis not present

## 2022-01-04 DIAGNOSIS — E785 Hyperlipidemia, unspecified: Secondary | ICD-10-CM | POA: Diagnosis not present

## 2022-01-06 DIAGNOSIS — E039 Hypothyroidism, unspecified: Secondary | ICD-10-CM | POA: Diagnosis not present

## 2022-01-11 DIAGNOSIS — D72819 Decreased white blood cell count, unspecified: Secondary | ICD-10-CM | POA: Diagnosis not present

## 2022-01-11 DIAGNOSIS — Z1331 Encounter for screening for depression: Secondary | ICD-10-CM | POA: Diagnosis not present

## 2022-01-11 DIAGNOSIS — Z0000 Encounter for general adult medical examination without abnormal findings: Secondary | ICD-10-CM | POA: Diagnosis not present

## 2022-01-11 DIAGNOSIS — Z1339 Encounter for screening examination for other mental health and behavioral disorders: Secondary | ICD-10-CM | POA: Diagnosis not present

## 2022-01-12 DIAGNOSIS — M2021 Hallux rigidus, right foot: Secondary | ICD-10-CM | POA: Diagnosis not present

## 2022-01-14 DIAGNOSIS — M47812 Spondylosis without myelopathy or radiculopathy, cervical region: Secondary | ICD-10-CM | POA: Diagnosis not present

## 2022-06-08 DIAGNOSIS — M542 Cervicalgia: Secondary | ICD-10-CM | POA: Diagnosis not present

## 2022-06-08 DIAGNOSIS — M545 Low back pain, unspecified: Secondary | ICD-10-CM | POA: Diagnosis not present

## 2022-06-15 DIAGNOSIS — M2021 Hallux rigidus, right foot: Secondary | ICD-10-CM | POA: Diagnosis not present

## 2022-06-28 DIAGNOSIS — M542 Cervicalgia: Secondary | ICD-10-CM | POA: Diagnosis not present

## 2022-06-28 DIAGNOSIS — M545 Low back pain, unspecified: Secondary | ICD-10-CM | POA: Diagnosis not present

## 2022-07-06 DIAGNOSIS — Z1231 Encounter for screening mammogram for malignant neoplasm of breast: Secondary | ICD-10-CM | POA: Diagnosis not present

## 2022-07-06 DIAGNOSIS — Z01419 Encounter for gynecological examination (general) (routine) without abnormal findings: Secondary | ICD-10-CM | POA: Diagnosis not present

## 2022-07-06 DIAGNOSIS — Z6828 Body mass index (BMI) 28.0-28.9, adult: Secondary | ICD-10-CM | POA: Diagnosis not present

## 2022-07-08 DIAGNOSIS — F411 Generalized anxiety disorder: Secondary | ICD-10-CM | POA: Diagnosis not present

## 2022-07-11 DIAGNOSIS — M542 Cervicalgia: Secondary | ICD-10-CM | POA: Diagnosis not present

## 2022-07-11 DIAGNOSIS — M545 Low back pain, unspecified: Secondary | ICD-10-CM | POA: Diagnosis not present

## 2022-07-15 DIAGNOSIS — F411 Generalized anxiety disorder: Secondary | ICD-10-CM | POA: Diagnosis not present

## 2022-07-26 DIAGNOSIS — M542 Cervicalgia: Secondary | ICD-10-CM | POA: Diagnosis not present

## 2022-07-26 DIAGNOSIS — M545 Low back pain, unspecified: Secondary | ICD-10-CM | POA: Diagnosis not present

## 2022-07-30 IMAGING — MR MR CERVICAL SPINE W/O CM
4 of 5 series · 30 of 48 positions shown · non-contrast
Comparison: None.

CLINICAL DATA: Left-sided neck pain radiating to posterior shoulder
and down left arm for 6 months

EXAM:
MRI CERVICAL SPINE WITHOUT CONTRAST
TECHNIQUE: Multiplanar, multisequence MR imaging of the cervical spine was
performed. No intravenous contrast was administered.

[Series 2: T2 · sagittal · 3.0mm · 0.82mm/px · 8 of 16 slices shown (1 of 2)]
[im 1/16]
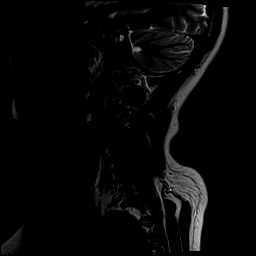
[im 3/16]
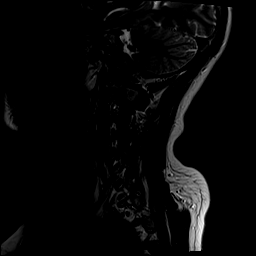
[im 5/16]
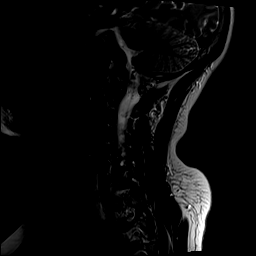
[im 7/16]
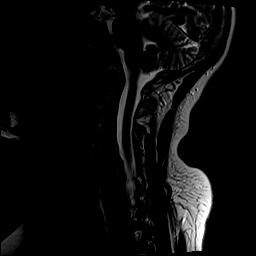
[im 9/16]
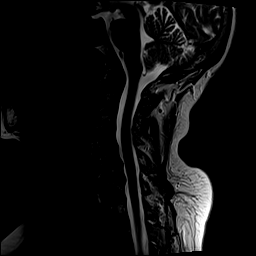
[im 11/16]
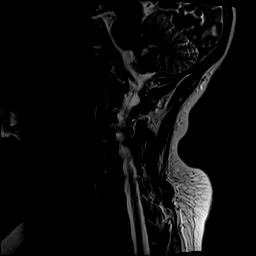
[im 13/16]
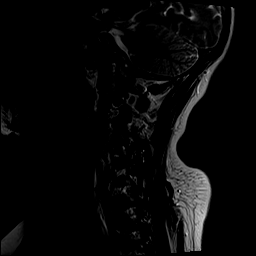
[im 16/16]
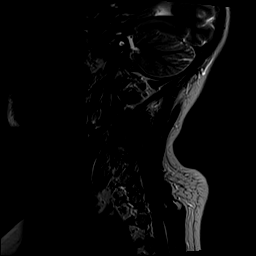

[Series 3: T1 · sagittal · 3.0mm · 0.41mm/px · 8 of 16 slices shown]
[im 1/16]
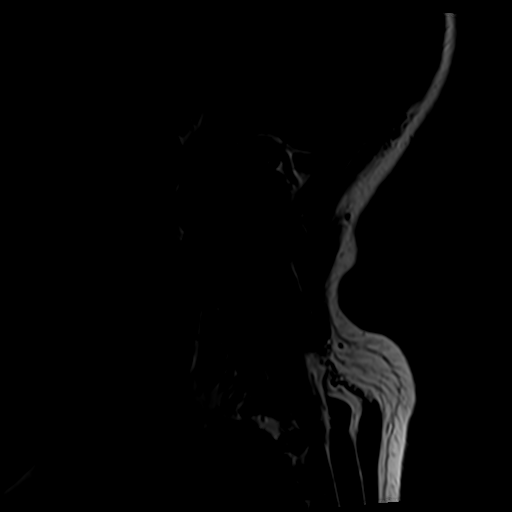
[im 3/16]
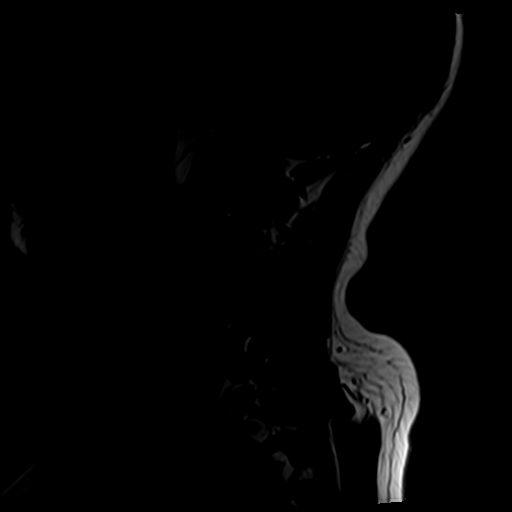
[im 5/16]
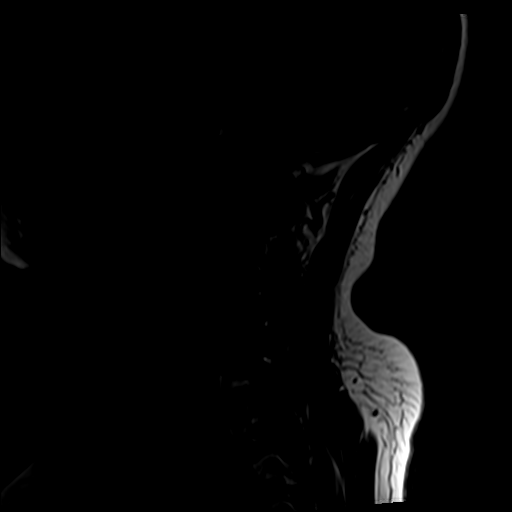
[im 7/16]
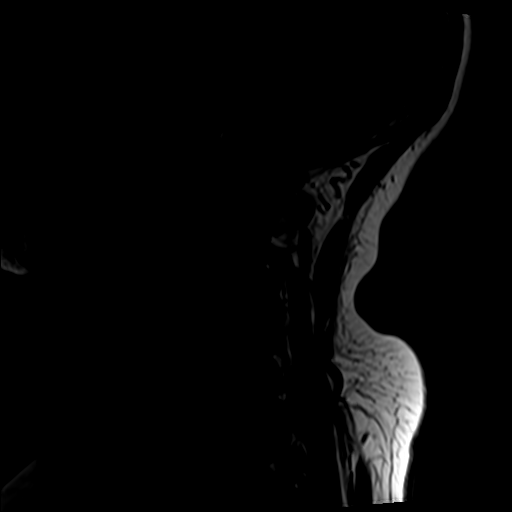
[im 9/16]
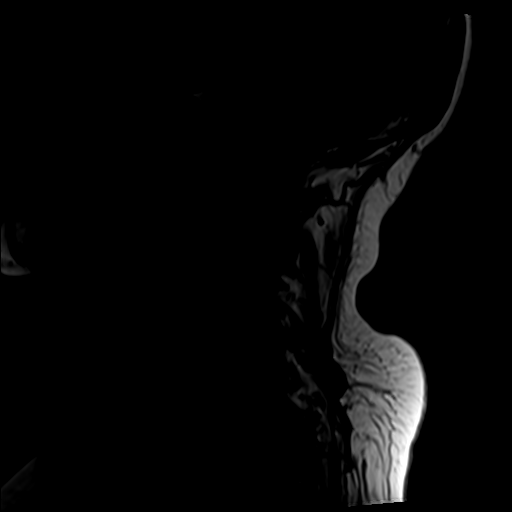
[im 11/16]
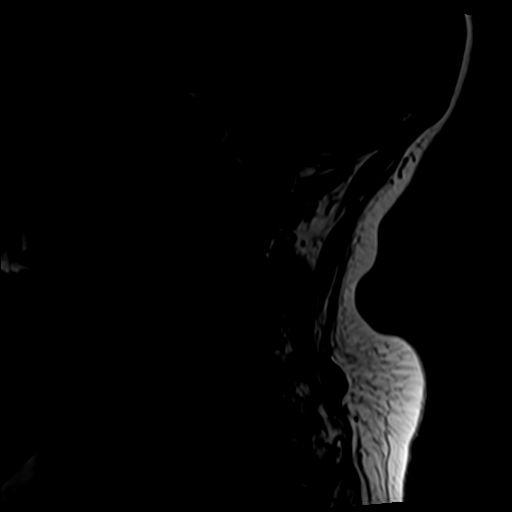
[im 13/16]
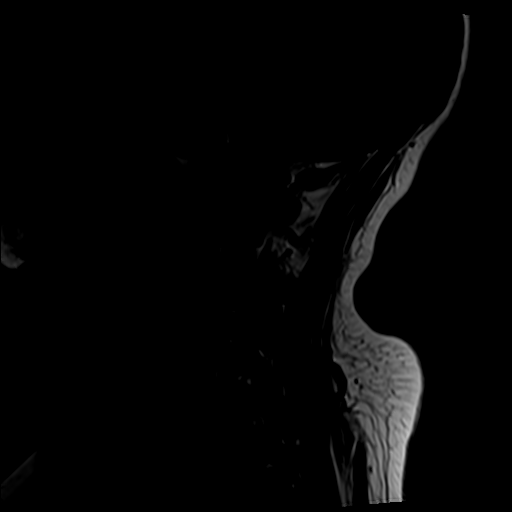
[im 16/16]
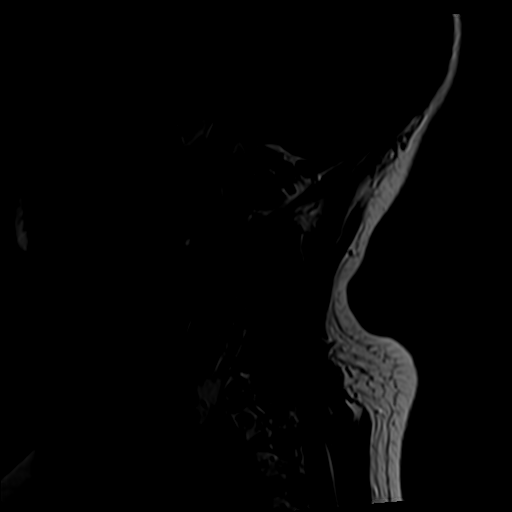

[Series 4: tir sag · sagittal · 3.0mm · 0.41mm/px · 5 of 16 slices shown]
[im 1/16]
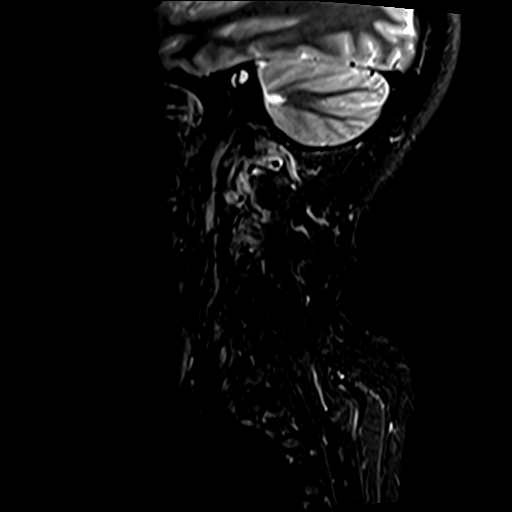
[im 3/16]
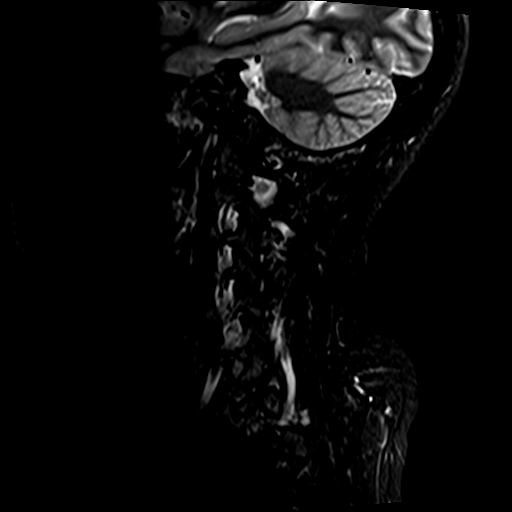
[im 5/16]
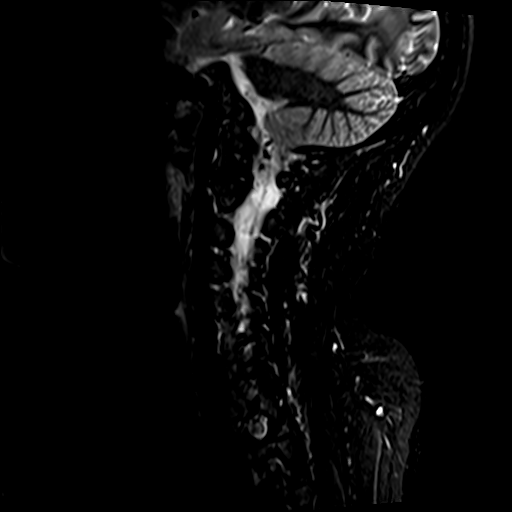
[im 9/16]
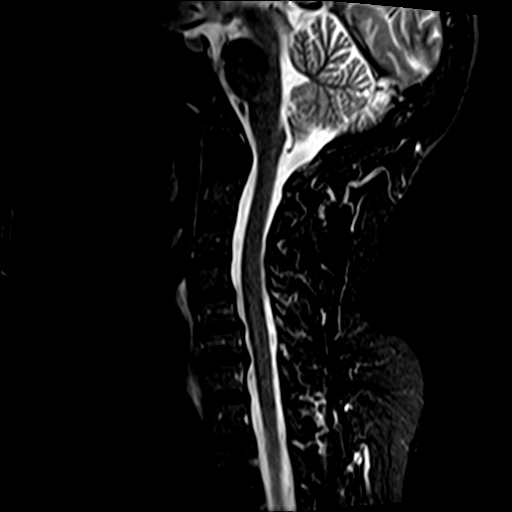
[im 13/16]
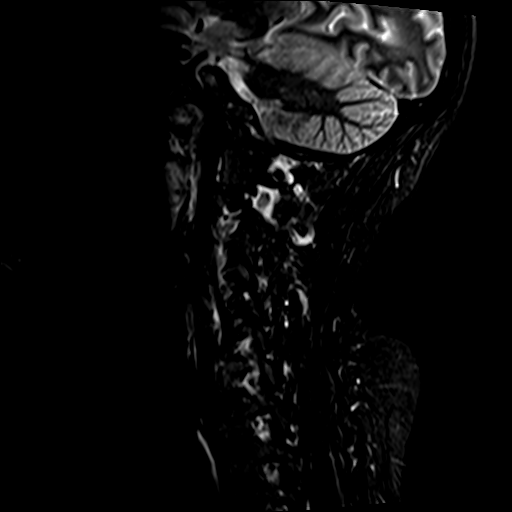

[Series 6: T2 · axial · 3.0mm · 0.70mm/px · z∈[-94,-8]mm · 9 of 25 slices shown (2 of 2)]
[im 1/25]
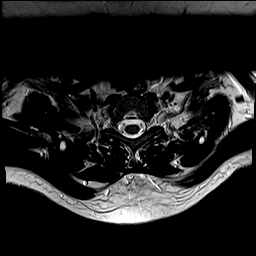
[im 5/25]
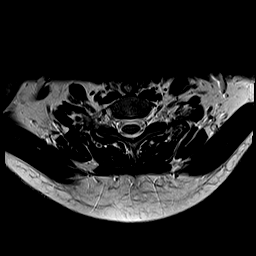
[im 7/25]
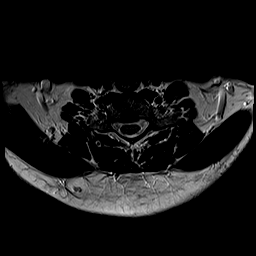
[im 11/25]
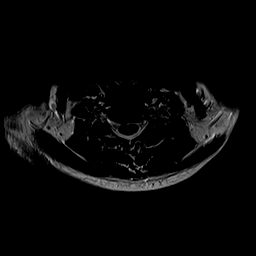
[im 14/25]
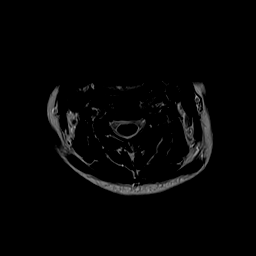
[im 18/25]
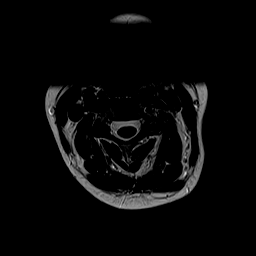
[im 20/25]
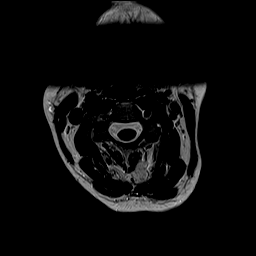
[im 22/25]
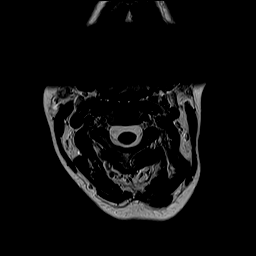
[im 25/25]
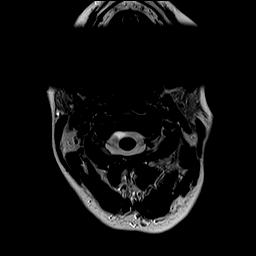

[30 of 48 positions shown; findings below may reference images not displayed]

FINDINGS: Alignment: There is grade 1 anterolisthesis of C5 on C6, likely
degenerative in nature. The alignment is otherwise normal.

Vertebrae: Vertebral body heights are preserved. Marrow signal is
normal.

Cord: Normal.

Posterior Fossa, vertebral arteries, paraspinal tissues: The imaged
brain and posterior fossa are unremarkable. The vertebral artery
flow voids are present. The paraspinal soft tissues are
unremarkable.

Disc levels:

There is marked intervertebral disc space narrowing and desiccation
at C5-C6 and C6-C7. The remaining disc spaces are overall preserved.
There is multilevel facet arthropathy, most advanced on the left at
C5-C6.

C2-C3: No significant spinal canal or neural foraminal stenosis.

C3-C4: There is mild uncovertebral and facet arthropathy resulting
in mild left and no significant right neural foraminal stenosis and
no significant spinal canal stenosis.

C4-C5: There is a mild posterior disc osteophyte complex and
uncovertebral and facet arthropathy resulting in mild-to-moderate
left and no significant right neural foraminal stenosis and no
significant spinal canal stenosis.

C5-C6: There is mild uncovering of the disc posteriorly with a
superimposed right paracentral protrusion and uncovertebral and
facet arthropathy resulting in mild spinal canal stenosis and
moderate left and no significant right neural foraminal stenosis.

C6-C7: There is a posterior disc osteophyte complex with a right
foraminal protrusion and uncovertebral and facet arthropathy
resulting in mild spinal canal stenosis and mild to moderate right
and no significant left neural foraminal stenosis.

C7-T1: No significant spinal canal or neural foraminal stenosis.
IMPRESSION: 1. Grade 1 anterolisthesis of C5 on C6, likely degenerative.
2. Degenerative changes throughout the cervical spine detailed above
resulting in mild-to-moderate left neural foraminal stenosis at
C4-C5, moderate left neural foraminal stenosis at C5-C6, and mild to
moderate right neural foraminal stenosis at C6-C7. No greater than
mild spinal canal stenosis at C5-C6 and C6-C7.
3. Multilevel facet arthropathy, most advanced on the left at C5-C6.

## 2022-08-10 DIAGNOSIS — F411 Generalized anxiety disorder: Secondary | ICD-10-CM | POA: Diagnosis not present

## 2022-08-15 DIAGNOSIS — H5203 Hypermetropia, bilateral: Secondary | ICD-10-CM | POA: Diagnosis not present

## 2022-08-15 DIAGNOSIS — H40053 Ocular hypertension, bilateral: Secondary | ICD-10-CM | POA: Diagnosis not present

## 2022-08-15 DIAGNOSIS — H524 Presbyopia: Secondary | ICD-10-CM | POA: Diagnosis not present

## 2022-08-31 DIAGNOSIS — F411 Generalized anxiety disorder: Secondary | ICD-10-CM | POA: Diagnosis not present

## 2022-09-10 DIAGNOSIS — Z23 Encounter for immunization: Secondary | ICD-10-CM | POA: Diagnosis not present

## 2022-09-15 DIAGNOSIS — M542 Cervicalgia: Secondary | ICD-10-CM | POA: Diagnosis not present

## 2022-09-15 DIAGNOSIS — M545 Low back pain, unspecified: Secondary | ICD-10-CM | POA: Diagnosis not present

## 2022-10-10 DIAGNOSIS — M25512 Pain in left shoulder: Secondary | ICD-10-CM | POA: Diagnosis not present

## 2022-10-10 DIAGNOSIS — M25511 Pain in right shoulder: Secondary | ICD-10-CM | POA: Diagnosis not present

## 2022-10-21 DIAGNOSIS — M25519 Pain in unspecified shoulder: Secondary | ICD-10-CM | POA: Diagnosis not present

## 2022-10-24 DIAGNOSIS — F411 Generalized anxiety disorder: Secondary | ICD-10-CM | POA: Diagnosis not present

## 2022-10-25 DIAGNOSIS — M25519 Pain in unspecified shoulder: Secondary | ICD-10-CM | POA: Diagnosis not present

## 2022-10-31 DIAGNOSIS — M2021 Hallux rigidus, right foot: Secondary | ICD-10-CM | POA: Diagnosis not present

## 2022-10-31 DIAGNOSIS — M25519 Pain in unspecified shoulder: Secondary | ICD-10-CM | POA: Diagnosis not present

## 2022-11-09 DIAGNOSIS — M25519 Pain in unspecified shoulder: Secondary | ICD-10-CM | POA: Diagnosis not present

## 2022-11-12 DIAGNOSIS — M25519 Pain in unspecified shoulder: Secondary | ICD-10-CM | POA: Diagnosis not present

## 2022-11-24 DIAGNOSIS — F411 Generalized anxiety disorder: Secondary | ICD-10-CM | POA: Diagnosis not present

## 2022-12-08 DIAGNOSIS — M47812 Spondylosis without myelopathy or radiculopathy, cervical region: Secondary | ICD-10-CM | POA: Diagnosis not present

## 2022-12-08 DIAGNOSIS — Z6829 Body mass index (BMI) 29.0-29.9, adult: Secondary | ICD-10-CM | POA: Diagnosis not present

## 2022-12-15 DIAGNOSIS — M542 Cervicalgia: Secondary | ICD-10-CM | POA: Diagnosis not present

## 2022-12-15 DIAGNOSIS — M4312 Spondylolisthesis, cervical region: Secondary | ICD-10-CM | POA: Diagnosis not present

## 2022-12-15 DIAGNOSIS — M47812 Spondylosis without myelopathy or radiculopathy, cervical region: Secondary | ICD-10-CM | POA: Diagnosis not present

## 2022-12-20 DIAGNOSIS — M47812 Spondylosis without myelopathy or radiculopathy, cervical region: Secondary | ICD-10-CM | POA: Diagnosis not present

## 2022-12-21 DIAGNOSIS — F411 Generalized anxiety disorder: Secondary | ICD-10-CM | POA: Diagnosis not present

## 2023-01-16 DIAGNOSIS — D72819 Decreased white blood cell count, unspecified: Secondary | ICD-10-CM | POA: Diagnosis not present

## 2023-01-16 DIAGNOSIS — E785 Hyperlipidemia, unspecified: Secondary | ICD-10-CM | POA: Diagnosis not present

## 2023-01-16 DIAGNOSIS — R002 Palpitations: Secondary | ICD-10-CM | POA: Diagnosis not present

## 2023-01-18 DIAGNOSIS — F411 Generalized anxiety disorder: Secondary | ICD-10-CM | POA: Diagnosis not present

## 2023-01-23 DIAGNOSIS — R82998 Other abnormal findings in urine: Secondary | ICD-10-CM | POA: Diagnosis not present

## 2023-01-23 DIAGNOSIS — Z1339 Encounter for screening examination for other mental health and behavioral disorders: Secondary | ICD-10-CM | POA: Diagnosis not present

## 2023-01-23 DIAGNOSIS — Z0000 Encounter for general adult medical examination without abnormal findings: Secondary | ICD-10-CM | POA: Diagnosis not present

## 2023-01-23 DIAGNOSIS — M79674 Pain in right toe(s): Secondary | ICD-10-CM | POA: Diagnosis not present

## 2023-01-23 DIAGNOSIS — M542 Cervicalgia: Secondary | ICD-10-CM | POA: Diagnosis not present

## 2023-01-23 DIAGNOSIS — Z1331 Encounter for screening for depression: Secondary | ICD-10-CM | POA: Diagnosis not present

## 2023-01-23 DIAGNOSIS — Z23 Encounter for immunization: Secondary | ICD-10-CM | POA: Diagnosis not present

## 2023-01-23 DIAGNOSIS — E669 Obesity, unspecified: Secondary | ICD-10-CM | POA: Diagnosis not present

## 2023-01-23 DIAGNOSIS — E785 Hyperlipidemia, unspecified: Secondary | ICD-10-CM | POA: Diagnosis not present

## 2023-06-19 DIAGNOSIS — M2021 Hallux rigidus, right foot: Secondary | ICD-10-CM | POA: Diagnosis not present

## 2023-08-23 DIAGNOSIS — Z1231 Encounter for screening mammogram for malignant neoplasm of breast: Secondary | ICD-10-CM | POA: Diagnosis not present

## 2023-08-23 DIAGNOSIS — Z6829 Body mass index (BMI) 29.0-29.9, adult: Secondary | ICD-10-CM | POA: Diagnosis not present

## 2023-08-23 DIAGNOSIS — Z01419 Encounter for gynecological examination (general) (routine) without abnormal findings: Secondary | ICD-10-CM | POA: Diagnosis not present

## 2023-09-06 DIAGNOSIS — Z23 Encounter for immunization: Secondary | ICD-10-CM | POA: Diagnosis not present

## 2023-09-28 DIAGNOSIS — F411 Generalized anxiety disorder: Secondary | ICD-10-CM | POA: Diagnosis not present

## 2023-10-16 DIAGNOSIS — M2021 Hallux rigidus, right foot: Secondary | ICD-10-CM | POA: Diagnosis not present

## 2023-10-18 DIAGNOSIS — M9902 Segmental and somatic dysfunction of thoracic region: Secondary | ICD-10-CM | POA: Diagnosis not present

## 2023-10-18 DIAGNOSIS — M7912 Myalgia of auxiliary muscles, head and neck: Secondary | ICD-10-CM | POA: Diagnosis not present

## 2023-10-18 DIAGNOSIS — M9901 Segmental and somatic dysfunction of cervical region: Secondary | ICD-10-CM | POA: Diagnosis not present

## 2023-10-18 DIAGNOSIS — M542 Cervicalgia: Secondary | ICD-10-CM | POA: Diagnosis not present

## 2023-11-02 DIAGNOSIS — H524 Presbyopia: Secondary | ICD-10-CM | POA: Diagnosis not present

## 2023-11-02 DIAGNOSIS — F411 Generalized anxiety disorder: Secondary | ICD-10-CM | POA: Diagnosis not present

## 2023-11-02 DIAGNOSIS — D3132 Benign neoplasm of left choroid: Secondary | ICD-10-CM | POA: Diagnosis not present

## 2023-11-03 DIAGNOSIS — M9902 Segmental and somatic dysfunction of thoracic region: Secondary | ICD-10-CM | POA: Diagnosis not present

## 2023-11-03 DIAGNOSIS — M9901 Segmental and somatic dysfunction of cervical region: Secondary | ICD-10-CM | POA: Diagnosis not present

## 2023-11-03 DIAGNOSIS — M542 Cervicalgia: Secondary | ICD-10-CM | POA: Diagnosis not present

## 2023-11-03 DIAGNOSIS — M7912 Myalgia of auxiliary muscles, head and neck: Secondary | ICD-10-CM | POA: Diagnosis not present

## 2023-11-09 DIAGNOSIS — M9902 Segmental and somatic dysfunction of thoracic region: Secondary | ICD-10-CM | POA: Diagnosis not present

## 2023-11-09 DIAGNOSIS — M9901 Segmental and somatic dysfunction of cervical region: Secondary | ICD-10-CM | POA: Diagnosis not present

## 2023-11-09 DIAGNOSIS — M7912 Myalgia of auxiliary muscles, head and neck: Secondary | ICD-10-CM | POA: Diagnosis not present

## 2023-11-09 DIAGNOSIS — M542 Cervicalgia: Secondary | ICD-10-CM | POA: Diagnosis not present

## 2023-11-21 DIAGNOSIS — Z713 Dietary counseling and surveillance: Secondary | ICD-10-CM | POA: Diagnosis not present

## 2023-12-05 DIAGNOSIS — Z13820 Encounter for screening for osteoporosis: Secondary | ICD-10-CM | POA: Diagnosis not present

## 2024-02-01 DIAGNOSIS — E785 Hyperlipidemia, unspecified: Secondary | ICD-10-CM | POA: Diagnosis not present

## 2024-02-02 DIAGNOSIS — M2021 Hallux rigidus, right foot: Secondary | ICD-10-CM | POA: Diagnosis not present

## 2024-02-08 DIAGNOSIS — Z1339 Encounter for screening examination for other mental health and behavioral disorders: Secondary | ICD-10-CM | POA: Diagnosis not present

## 2024-02-08 DIAGNOSIS — Z1331 Encounter for screening for depression: Secondary | ICD-10-CM | POA: Diagnosis not present

## 2024-02-08 DIAGNOSIS — Z0000 Encounter for general adult medical examination without abnormal findings: Secondary | ICD-10-CM | POA: Diagnosis not present

## 2024-02-08 DIAGNOSIS — K581 Irritable bowel syndrome with constipation: Secondary | ICD-10-CM | POA: Diagnosis not present

## 2024-02-12 DIAGNOSIS — M542 Cervicalgia: Secondary | ICD-10-CM | POA: Diagnosis not present

## 2024-02-12 DIAGNOSIS — M545 Low back pain, unspecified: Secondary | ICD-10-CM | POA: Diagnosis not present

## 2024-02-20 DIAGNOSIS — M545 Low back pain, unspecified: Secondary | ICD-10-CM | POA: Diagnosis not present

## 2024-02-20 DIAGNOSIS — M542 Cervicalgia: Secondary | ICD-10-CM | POA: Diagnosis not present

## 2024-02-29 DIAGNOSIS — M545 Low back pain, unspecified: Secondary | ICD-10-CM | POA: Diagnosis not present

## 2024-02-29 DIAGNOSIS — M542 Cervicalgia: Secondary | ICD-10-CM | POA: Diagnosis not present

## 2024-03-06 DIAGNOSIS — M542 Cervicalgia: Secondary | ICD-10-CM | POA: Diagnosis not present

## 2024-03-27 DIAGNOSIS — F411 Generalized anxiety disorder: Secondary | ICD-10-CM | POA: Diagnosis not present

## 2024-04-10 DIAGNOSIS — M542 Cervicalgia: Secondary | ICD-10-CM | POA: Diagnosis not present

## 2024-06-24 DIAGNOSIS — F411 Generalized anxiety disorder: Secondary | ICD-10-CM | POA: Diagnosis not present

## 2024-07-03 DIAGNOSIS — M2021 Hallux rigidus, right foot: Secondary | ICD-10-CM | POA: Diagnosis not present

## 2024-07-11 DIAGNOSIS — M19071 Primary osteoarthritis, right ankle and foot: Secondary | ICD-10-CM | POA: Diagnosis not present

## 2024-07-11 DIAGNOSIS — M79674 Pain in right toe(s): Secondary | ICD-10-CM | POA: Diagnosis not present

## 2024-07-17 DIAGNOSIS — F411 Generalized anxiety disorder: Secondary | ICD-10-CM | POA: Diagnosis not present

## 2024-09-03 DIAGNOSIS — Z86718 Personal history of other venous thrombosis and embolism: Secondary | ICD-10-CM | POA: Diagnosis not present

## 2024-09-03 DIAGNOSIS — Z87891 Personal history of nicotine dependence: Secondary | ICD-10-CM | POA: Diagnosis not present

## 2024-09-03 DIAGNOSIS — Z7901 Long term (current) use of anticoagulants: Secondary | ICD-10-CM | POA: Diagnosis not present

## 2024-09-03 DIAGNOSIS — M19071 Primary osteoarthritis, right ankle and foot: Secondary | ICD-10-CM | POA: Diagnosis not present

## 2024-09-03 DIAGNOSIS — M2021 Hallux rigidus, right foot: Secondary | ICD-10-CM | POA: Diagnosis not present

## 2024-09-03 DIAGNOSIS — M19072 Primary osteoarthritis, left ankle and foot: Secondary | ICD-10-CM | POA: Diagnosis not present

## 2024-09-19 DIAGNOSIS — Z1151 Encounter for screening for human papillomavirus (HPV): Secondary | ICD-10-CM | POA: Diagnosis not present

## 2024-09-19 DIAGNOSIS — Z124 Encounter for screening for malignant neoplasm of cervix: Secondary | ICD-10-CM | POA: Diagnosis not present

## 2024-09-19 DIAGNOSIS — Z6830 Body mass index (BMI) 30.0-30.9, adult: Secondary | ICD-10-CM | POA: Diagnosis not present

## 2024-09-19 DIAGNOSIS — Z1231 Encounter for screening mammogram for malignant neoplasm of breast: Secondary | ICD-10-CM | POA: Diagnosis not present

## 2024-09-19 DIAGNOSIS — Z01419 Encounter for gynecological examination (general) (routine) without abnormal findings: Secondary | ICD-10-CM | POA: Diagnosis not present
